# Patient Record
Sex: Female | Born: 1975 | Race: White | Hispanic: No | Marital: Married | Smoking: Never smoker
Health system: Southern US, Community
[De-identification: ages and names within clinical notes are randomized; demographics above are authoritative.]

## PROBLEM LIST (undated history)

## (undated) DIAGNOSIS — Z9889 Other specified postprocedural states: Secondary | ICD-10-CM

## (undated) DIAGNOSIS — N920 Excessive and frequent menstruation with regular cycle: Secondary | ICD-10-CM

## (undated) DIAGNOSIS — M51369 Other intervertebral disc degeneration, lumbar region without mention of lumbar back pain or lower extremity pain: Secondary | ICD-10-CM

## (undated) DIAGNOSIS — T8859XA Other complications of anesthesia, initial encounter: Secondary | ICD-10-CM

## (undated) DIAGNOSIS — Z8742 Personal history of other diseases of the female genital tract: Secondary | ICD-10-CM

## (undated) DIAGNOSIS — G43909 Migraine, unspecified, not intractable, without status migrainosus: Secondary | ICD-10-CM

## (undated) DIAGNOSIS — M1990 Unspecified osteoarthritis, unspecified site: Secondary | ICD-10-CM

## (undated) DIAGNOSIS — R112 Nausea with vomiting, unspecified: Secondary | ICD-10-CM

## (undated) DIAGNOSIS — Z8489 Family history of other specified conditions: Secondary | ICD-10-CM

## (undated) DIAGNOSIS — M5136 Other intervertebral disc degeneration, lumbar region: Secondary | ICD-10-CM

## (undated) DIAGNOSIS — Z973 Presence of spectacles and contact lenses: Secondary | ICD-10-CM

## (undated) HISTORY — PX: AUGMENTATION MAMMAPLASTY: SUR837

## (undated) HISTORY — PX: KNEE SURGERY: SHX244

## (undated) HISTORY — PX: WRIST SURGERY: SHX841

---
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
## 2000-08-05 ENCOUNTER — Other Ambulatory Visit: Admission: RE | Admit: 2000-08-05 | Discharge: 2000-08-05 | Payer: Self-pay | Admitting: Obstetrics and Gynecology

## 2001-01-17 ENCOUNTER — Other Ambulatory Visit: Admission: RE | Admit: 2001-01-17 | Discharge: 2001-01-17 | Payer: Self-pay | Admitting: Obstetrics and Gynecology

## 2001-06-11 ENCOUNTER — Other Ambulatory Visit: Admission: RE | Admit: 2001-06-11 | Discharge: 2001-06-11 | Payer: Self-pay | Admitting: Obstetrics and Gynecology

## 2001-10-01 ENCOUNTER — Other Ambulatory Visit: Admission: RE | Admit: 2001-10-01 | Discharge: 2001-10-01 | Payer: Self-pay | Admitting: Obstetrics and Gynecology

## 2002-04-03 ENCOUNTER — Other Ambulatory Visit: Admission: RE | Admit: 2002-04-03 | Discharge: 2002-04-03 | Payer: Self-pay | Admitting: Obstetrics and Gynecology

## 2002-10-14 ENCOUNTER — Other Ambulatory Visit: Admission: RE | Admit: 2002-10-14 | Discharge: 2002-10-14 | Payer: Self-pay | Admitting: Obstetrics and Gynecology

## 2002-10-16 ENCOUNTER — Other Ambulatory Visit: Admission: RE | Admit: 2002-10-16 | Discharge: 2002-10-16 | Payer: Self-pay | Admitting: Obstetrics and Gynecology

## 2003-02-13 HISTORY — PX: BREAST ENHANCEMENT SURGERY: SHX7

## 2003-05-28 ENCOUNTER — Other Ambulatory Visit: Admission: RE | Admit: 2003-05-28 | Discharge: 2003-05-28 | Payer: Self-pay | Admitting: Obstetrics and Gynecology

## 2003-11-08 ENCOUNTER — Other Ambulatory Visit: Admission: RE | Admit: 2003-11-08 | Discharge: 2003-11-08 | Payer: Self-pay | Admitting: Obstetrics and Gynecology

## 2004-11-20 ENCOUNTER — Other Ambulatory Visit: Admission: RE | Admit: 2004-11-20 | Discharge: 2004-11-20 | Payer: Self-pay | Admitting: Obstetrics and Gynecology

## 2007-03-13 ENCOUNTER — Encounter: Admission: RE | Admit: 2007-03-13 | Discharge: 2007-03-13 | Payer: Self-pay | Admitting: Occupational Medicine

## 2007-06-18 ENCOUNTER — Encounter: Admission: RE | Admit: 2007-06-18 | Discharge: 2007-06-18 | Payer: Self-pay | Admitting: Internal Medicine

## 2009-06-14 ENCOUNTER — Encounter: Admission: RE | Admit: 2009-06-14 | Discharge: 2009-06-14 | Payer: Self-pay | Admitting: Family Medicine

## 2010-02-12 DIAGNOSIS — R87612 Low grade squamous intraepithelial lesion on cytologic smear of cervix (LGSIL): Secondary | ICD-10-CM | POA: Insufficient documentation

## 2010-02-12 HISTORY — PX: KNEE ARTHROSCOPY: SUR90

## 2010-03-05 ENCOUNTER — Encounter: Payer: Self-pay | Admitting: Internal Medicine

## 2010-04-11 ENCOUNTER — Other Ambulatory Visit: Payer: Self-pay | Admitting: Internal Medicine

## 2010-04-11 DIAGNOSIS — M25562 Pain in left knee: Secondary | ICD-10-CM

## 2010-04-13 ENCOUNTER — Ambulatory Visit
Admission: RE | Admit: 2010-04-13 | Discharge: 2010-04-13 | Disposition: A | Discharge status: Home / Self Care | Payer: BC Managed Care – PPO | Source: Ambulatory Visit | Attending: Internal Medicine | Admitting: Internal Medicine

## 2010-04-13 DIAGNOSIS — M25562 Pain in left knee: Secondary | ICD-10-CM

## 2010-07-18 ENCOUNTER — Other Ambulatory Visit: Payer: Self-pay | Admitting: Sports Medicine

## 2010-07-18 ENCOUNTER — Ambulatory Visit
Admission: RE | Admit: 2010-07-18 | Discharge: 2010-07-18 | Disposition: A | Discharge status: Home / Self Care | Payer: BC Managed Care – PPO | Source: Ambulatory Visit | Attending: Sports Medicine | Admitting: Sports Medicine

## 2010-07-18 DIAGNOSIS — J45909 Unspecified asthma, uncomplicated: Secondary | ICD-10-CM

## 2010-08-10 ENCOUNTER — Other Ambulatory Visit: Payer: Self-pay | Admitting: Orthopedic Surgery

## 2010-08-10 DIAGNOSIS — M25531 Pain in right wrist: Secondary | ICD-10-CM

## 2010-08-18 ENCOUNTER — Ambulatory Visit
Admission: RE | Admit: 2010-08-18 | Discharge: 2010-08-18 | Disposition: A | Discharge status: Home / Self Care | Payer: BC Managed Care – PPO | Source: Ambulatory Visit | Attending: Orthopedic Surgery | Admitting: Orthopedic Surgery

## 2010-08-18 DIAGNOSIS — M25531 Pain in right wrist: Secondary | ICD-10-CM

## 2013-12-29 DIAGNOSIS — M84376A Stress fracture, unspecified foot, initial encounter for fracture: Secondary | ICD-10-CM | POA: Insufficient documentation

## 2014-02-12 DIAGNOSIS — R635 Abnormal weight gain: Secondary | ICD-10-CM | POA: Insufficient documentation

## 2015-03-29 DIAGNOSIS — N83201 Unspecified ovarian cyst, right side: Secondary | ICD-10-CM | POA: Insufficient documentation

## 2015-03-29 DIAGNOSIS — D259 Leiomyoma of uterus, unspecified: Secondary | ICD-10-CM | POA: Insufficient documentation

## 2015-04-26 DIAGNOSIS — N939 Abnormal uterine and vaginal bleeding, unspecified: Secondary | ICD-10-CM | POA: Insufficient documentation

## 2015-04-26 DIAGNOSIS — R232 Flushing: Secondary | ICD-10-CM | POA: Insufficient documentation

## 2015-06-22 DIAGNOSIS — N83202 Unspecified ovarian cyst, left side: Secondary | ICD-10-CM | POA: Insufficient documentation

## 2015-11-08 DIAGNOSIS — M545 Low back pain, unspecified: Secondary | ICD-10-CM | POA: Insufficient documentation

## 2016-06-05 DIAGNOSIS — N9089 Other specified noninflammatory disorders of vulva and perineum: Secondary | ICD-10-CM | POA: Insufficient documentation

## 2017-02-12 HISTORY — PX: ANTERIOR CERVICAL DECOMP/DISCECTOMY FUSION: SHX1161

## 2017-05-25 ENCOUNTER — Encounter (HOSPITAL_COMMUNITY): Payer: Self-pay | Admitting: Emergency Medicine

## 2017-05-25 ENCOUNTER — Emergency Department (HOSPITAL_COMMUNITY)
Admission: EM | Admit: 2017-05-25 | Discharge: 2017-05-25 | Disposition: A | Discharge status: Home / Self Care | Payer: BC Managed Care – PPO | Attending: Emergency Medicine | Admitting: Emergency Medicine

## 2017-05-25 ENCOUNTER — Emergency Department (HOSPITAL_COMMUNITY): Payer: BC Managed Care – PPO

## 2017-05-25 DIAGNOSIS — R200 Anesthesia of skin: Secondary | ICD-10-CM | POA: Insufficient documentation

## 2017-05-25 DIAGNOSIS — M5412 Radiculopathy, cervical region: Secondary | ICD-10-CM | POA: Diagnosis not present

## 2017-05-25 DIAGNOSIS — M542 Cervicalgia: Secondary | ICD-10-CM | POA: Diagnosis present

## 2017-05-25 DIAGNOSIS — M5410 Radiculopathy, site unspecified: Secondary | ICD-10-CM

## 2017-05-25 DIAGNOSIS — R531 Weakness: Secondary | ICD-10-CM | POA: Insufficient documentation

## 2017-05-25 HISTORY — DX: Other intervertebral disc degeneration, lumbar region without mention of lumbar back pain or lower extremity pain: M51.369

## 2017-05-25 HISTORY — DX: Other intervertebral disc degeneration, lumbar region: M51.36

## 2017-05-25 LAB — COMPREHENSIVE METABOLIC PANEL
ALBUMIN: 3.8 g/dL (ref 3.5–5.0)
ALT: 19 U/L (ref 14–54)
ANION GAP: 10 (ref 5–15)
AST: 11 U/L — ABNORMAL LOW (ref 15–41)
Alkaline Phosphatase: 42 U/L (ref 38–126)
BILIRUBIN TOTAL: 0.2 mg/dL — AB (ref 0.3–1.2)
BUN: 18 mg/dL (ref 6–20)
CALCIUM: 8.7 mg/dL — AB (ref 8.9–10.3)
CO2: 23 mmol/L (ref 22–32)
Chloride: 106 mmol/L (ref 101–111)
Creatinine, Ser: 0.74 mg/dL (ref 0.44–1.00)
GFR calc non Af Amer: >60 mL/min (ref >60)
GLUCOSE: 104 mg/dL — AB (ref 65–99)
POTASSIUM: 3.5 mmol/L (ref 3.5–5.1)
SODIUM: 139 mmol/L (ref 135–145)
TOTAL PROTEIN: 6.7 g/dL (ref 6.5–8.1)

## 2017-05-25 LAB — CBC WITH DIFFERENTIAL/PLATELET
BASOS ABS: 0 10*3/uL (ref 0.0–0.1)
BASOS PCT: 0 %
EOS ABS: 0 10*3/uL (ref 0.0–0.7)
Eosinophils Relative: 0 %
HCT: 40.5 % (ref 36.0–46.0)
Hemoglobin: 13.6 g/dL (ref 12.0–15.0)
LYMPHS ABS: 3.2 10*3/uL (ref 0.7–4.0)
Lymphocytes Relative: 23 %
MCH: 31.3 pg (ref 26.0–34.0)
MCHC: 33.6 g/dL (ref 30.0–36.0)
MCV: 93.3 fL (ref 78.0–100.0)
Monocytes Absolute: 1 10*3/uL (ref 0.1–1.0)
Monocytes Relative: 7 %
NEUTROS ABS: 9.5 10*3/uL — AB (ref 1.7–7.7)
Neutrophils Relative %: 70 %
Platelets: 403 10*3/uL — ABNORMAL HIGH (ref 150–400)
RBC: 4.34 MIL/uL (ref 3.87–5.11)
RDW: 12.2 % (ref 11.5–15.5)
WBC: 13.7 10*3/uL — ABNORMAL HIGH (ref 4.0–10.5)

## 2017-05-25 MED ORDER — OXYCODONE-ACETAMINOPHEN 5-325 MG PO TABS: 2.0000 | ORAL_TABLET | ORAL | 0 refills | Status: DC | PRN

## 2017-05-25 MED ORDER — ONDANSETRON HCL 4 MG/2ML IJ SOLN
4.0000 mg | Freq: Once | INTRAMUSCULAR | Status: AC
Start: 2017-05-25 — End: 2017-05-25
  Administered 2017-05-25: 4 mg via INTRAVENOUS
  Filled 2017-05-25: qty 2

## 2017-05-25 MED ORDER — FENTANYL CITRATE (PF) 100 MCG/2ML IJ SOLN
50.0000 ug | Freq: Once | INTRAMUSCULAR | Status: AC
  Administered 2017-05-25: 50 ug via INTRAVENOUS
  Filled 2017-05-25: qty 2

## 2017-05-25 MED ORDER — MORPHINE SULFATE (PF) 4 MG/ML IV SOLN
4.0000 mg | Freq: Once | INTRAVENOUS | Status: AC
  Administered 2017-05-25: 4 mg via INTRAVENOUS
  Filled 2017-05-25: qty 1

## 2017-05-25 MED ORDER — DIAZEPAM 5 MG PO TABS: 5.0000 mg | ORAL_TABLET | Freq: Every evening | ORAL | 0 refills | Status: DC | PRN

## 2017-05-25 MED ORDER — DIAZEPAM 5 MG PO TABS
5.0000 mg | ORAL_TABLET | Freq: Once | ORAL | Status: AC
  Administered 2017-05-25: 5 mg via ORAL
  Filled 2017-05-25: qty 1

## 2017-05-25 MED ORDER — ONDANSETRON 4 MG PO TBDP: 4.0000 mg | ORAL_TABLET | Freq: Three times a day (TID) | ORAL | 0 refills | Status: DC | PRN

## 2017-05-25 NOTE — Discharge Instructions (Addendum)
Percocet for pain. Valium for sleep. Call [facility] on Monday.  Dr. Fayrene FearingJames spoke with their provider tonight.  He will get the next available appointment.

## 2017-05-25 NOTE — ED Triage Notes (Signed)
Patient presents ambulatory stating [facility] and diagnosed with pinched nerve in her neck. Pt given steroids and medications but not having relief.

## 2017-05-25 NOTE — ED Provider Notes (Signed)
Care assumed from Dr. Macuen at 1600. I was [name] to follow-up on MRI results. I reviewed the results of the MRI and reexamined the patient.  She reports fairly classic C6 radicular pain.  She has minimal weakness to her biceps.  She has otherwise normal neurological exam she does not have subjective points, or objective findings of left upper extremity, or lower extreme the symptoms.  She has a normal gait.  Normal reflexes.  Normal strength to the left upper, and bilateral lower extremities.  She reports normal bowel bladder habits.  She has not had incontinence of bowel or bladder and has had a bowel movement and urinated today.  Discussed the findings with the provider on for [facility].  They will see her on Monday if she calls for an appointment.  Her MRI does not suggest risk of myelopathy at this point, it is her exam.  I discussed with her that she has any left upper, or lower chimney symptoms or change in bowel or bladder habits she is to return immediately to the emergency room.  This was discussed with her in front of her to family members that accompany her thorough understanding was expressed.   Stacey Rios, Stacey Popko, MD 05/25/17 1850

## 2017-05-25 NOTE — ED Notes (Signed)
Patient transferred to MRI 

## 2017-05-25 NOTE — ED Provider Notes (Signed)
[facility]-EMERGENCY DEPT Provider Note   [name]: [id number] Arrival date & time: 05/25/17  1401     History   Chief Complaint Chief Complaint  Patient presents with  . Neck Pain    HPI Stacey Rios is a 42 y.o. female.  HPI   42 year old female presenting with weakness numbness on 3 fingers on the right-hand side.  Patient reports weakness.  Having trouble difficulty holding things.  Patient has history of ligamentous injuries in both wrists with surgeries completed by Dr. Mina MarbleWeingold.  Reports that two weeks ago she went on a 15 mile kayak.  When she got back home she used and axe.   2 days after this she started developing some pain in her right trapezius.  Went to see her primary care who gave her Flexeril and a steroid taper.  It continued to get worse they did another steroid taper.  They continue to get worse now she is weak and numbness and tingling.  She is here from her primary care for MRI.  Past Medical History:  Diagnosis Date  . Degenerative disc disease, lumbar     There are no active problems to display for this patient.   Past Surgical History:  Procedure Laterality Date  . KNEE SURGERY       OB History   None      Home Medications    Prior to Admission medications   Not on File    Family History No family history on file.  Social History Social History   Tobacco Use  . Smoking status: Never Smoker  . Smokeless tobacco: Never Used  Substance Use Topics  . Alcohol use: Yes  . Drug use: Yes    Types: Marijuana     Allergies   Ibuprofen   Review of Systems Review of Systems  Neurological: Positive for weakness and numbness.  All other systems reviewed and are negative.    Physical Exam Updated Vital Signs BP (!) 154/92 (BP Location: Left Arm)   Pulse (!) 119   Temp 99.5 F (37.5 C) (Oral)   Resp 16   SpO2 100%   Physical Exam  Constitutional: She is oriented to person, place, and time. She appears  well-developed and well-nourished.  HENT:  Head: Normocephalic and atraumatic.  Eyes: Right eye exhibits no discharge. Left eye exhibits no discharge.  Cardiovascular:  No murmur heard. Tachycardic.  Pulmonary/Chest: Effort normal.  Musculoskeletal:  Numbness and tingling to first 3 fingers on right.  Weakness when fingers placed in okay sign,  disrupting circle.  Intraosseous weakness in the first 3 digits.  Neurological: She is oriented to person, place, and time.  Skin: Skin is warm and dry. She is not diaphoretic.  Psychiatric: She has a normal mood and affect.  Nursing note and vitals reviewed.    ED Treatments / Results  Labs (all labs ordered are listed, but only abnormal results are displayed) Labs Reviewed  CBC WITH DIFFERENTIAL/PLATELET  COMPREHENSIVE METABOLIC PANEL    EKG None  Radiology No results found.  Procedures Procedures (including critical care time)  Medications Ordered in ED Medications - No data to display   Initial Impression / Assessment and Plan / ED Course  I have reviewed the triage vital signs and the nursing notes.  Pertinent labs & imaging results that were available during my care of the patient were reviewed by me and considered in my medical decision making (see chart for details).     42 year old  female presenting with weakness numbness on 3 fingers on the right-hand side.  Patient reports weakness.  Having trouble difficulty holding things.  Patient has history of ligamentous injuries in both wrists with surgeries completed by Dr. Mina Marble.  Reports that two weeks ago she went on a 15 mile kayak.  When she got back home she used an axe to chop things.   2 days after this she started developing some pain in her right trapezius.  Went to see her primary care who gave her Flexeril and a steroid taper.  It continued to get worse they did another steroid taper.  They continue to get worse now she is weak and numbness and tingling.  She  is here from her primary care for MRI.  2:48 PM Given patient's new weakness and numbness, will require an MRI imaging at this time.  Will give patient pain medication while awaiting MRI.     Final Clinical Impressions(s) / ED Diagnoses   Final diagnoses:  None    ED Discharge Orders    None       Chris Cripps, Cindee Salt, MD 05/26/17 1553

## 2017-05-27 ENCOUNTER — Emergency Department (HOSPITAL_COMMUNITY)
Admission: EM | Admit: 2017-05-27 | Discharge: 2017-05-27 | Disposition: A | Discharge status: Home / Self Care | Payer: BC Managed Care – PPO | Attending: Emergency Medicine | Admitting: Emergency Medicine

## 2017-05-27 ENCOUNTER — Encounter (HOSPITAL_COMMUNITY): Payer: Self-pay | Admitting: Emergency Medicine

## 2017-05-27 DIAGNOSIS — R202 Paresthesia of skin: Secondary | ICD-10-CM | POA: Insufficient documentation

## 2017-05-27 DIAGNOSIS — Z79899 Other long term (current) drug therapy: Secondary | ICD-10-CM | POA: Insufficient documentation

## 2017-05-27 DIAGNOSIS — M5020 Other cervical disc displacement, unspecified cervical region: Secondary | ICD-10-CM

## 2017-05-27 DIAGNOSIS — M62838 Other muscle spasm: Secondary | ICD-10-CM

## 2017-05-27 DIAGNOSIS — M50222 Other cervical disc displacement at C5-C6 level: Secondary | ICD-10-CM | POA: Insufficient documentation

## 2017-05-27 DIAGNOSIS — M542 Cervicalgia: Secondary | ICD-10-CM | POA: Diagnosis present

## 2017-05-27 MED ORDER — DIAZEPAM 5 MG PO TABS: 5.0000 mg | ORAL_TABLET | Freq: Four times a day (QID) | ORAL | 0 refills | Status: DC | PRN

## 2017-05-27 MED ORDER — OXYCODONE-ACETAMINOPHEN 5-325 MG PO TABS: 1.0000 | ORAL_TABLET | Freq: Four times a day (QID) | ORAL | 0 refills | Status: DC | PRN

## 2017-05-27 MED ORDER — MELOXICAM 15 MG PO TABS: 15.0000 mg | ORAL_TABLET | Freq: Every day | ORAL | 0 refills | Status: DC

## 2017-05-27 MED ORDER — MORPHINE SULFATE (PF) 4 MG/ML IV SOLN
4.0000 mg | Freq: Once | INTRAVENOUS | Status: AC
  Administered 2017-05-27: 4 mg via INTRAMUSCULAR
  Filled 2017-05-27: qty 1

## 2017-05-27 MED ORDER — DIAZEPAM 5 MG PO TABS
5.0000 mg | ORAL_TABLET | Freq: Once | ORAL | Status: AC
  Administered 2017-05-27: 5 mg via ORAL
  Filled 2017-05-27: qty 1

## 2017-05-27 MED ORDER — ONDANSETRON 8 MG PO TBDP
8.0000 mg | ORAL_TABLET | Freq: Once | ORAL | Status: AC
  Administered 2017-05-27: 8 mg via ORAL
  Filled 2017-05-27: qty 1

## 2017-05-27 NOTE — ED Provider Notes (Addendum)
[facility]   [name]: [id number] Arrival date & time: 05/27/17  1122     History   Chief Complaint Chief Complaint  Patient presents with  . Neck Pain    HPI Stacey Rios is a 42 y.o. female with a PMHx of lumbar degenerative disc disease, who presents to the ED with complaints of ongoing unchanged R sided neck pain that's been going on for about 2wks.  Chart review reveals she was seen here on 05/25/17 for R neck pain and R hand weakness/paresthesias x2 wks after going kayaking and using an axe (symptoms started 2 days after those activities); she was given flexeril and steroid pack by her PCP however hadn't improved so she was sent to the ED; here she had a C-spine MRI performed which showed C5-6 R paracentral herniation flattening the R ventral cord and impinging the R C6 nerve root; EDP spoke with provider on call for [facility] who stated they'd see her today if she called for an appt but did not feel she needed emergent intervention; she was discharged home with Valium 5mg  (5 tabs), Oxycodone-APAP 5-325mg  (6 tabs), and zofran rx's.  She states that those medications helped somewhat however she has run out, and she called the neurosurgeon today but has not heard back yet regarding an appointment.  She denies any changes in her pain, simply states that she continues to have pain and came in because she has not yet gotten an appointment with neurosurgery so she needs refills on her medications in order to make it until she can follow-up with neurosurgery.  She describes her pain as 10/10 constant sharp stabbing and burning right-sided neck pain that radiates into the right arm, with no known aggravating factors, minimally improved with Valium and Percocet, and unrelieved with Tylenol, Flexeril, and prednisone.  She has also been using heat with no significant relief.  She reports associated right first through third digit paresthesias and  weakness which is also not changed since her last visit.  She denies fevers, chills, CP, SOB, abd pain, N/V/D/C, hematuria, dysuria, incontinence of urine/stool, saddle anesthesia/cauda equina symptoms, other arthralgias, complete numbness, other areas of tingling/weakness, or any other complaints at this time.  The history is provided by the patient and medical records. No language interpreter was used.  Neck Pain   This is a new problem. The current episode started more than 1 week ago. The problem occurs constantly. The problem has not changed since onset.The pain is associated with lifting a heavy object and twisting. There has been no fever. The pain is present in the right side. The quality of the pain is described as burning and stabbing. The pain radiates to the right shoulder and right arm. The pain is at a severity of 10/10. The pain is moderate. Exacerbated by: nothing. The pain is the same all the time. Associated symptoms include numbness (paresthesias R 1st-3rd digits), tingling and weakness (R 1st-3rd digits). Pertinent negatives include no chest pain, no bowel incontinence, no bladder incontinence and no paresis. She has tried analgesics, muscle relaxants and heat for the symptoms. The treatment provided mild relief.    Past Medical History:  Diagnosis Date  . Degenerative disc disease, lumbar     There are no active problems to display for this patient.   Past Surgical History:  Procedure Laterality Date  . KNEE SURGERY       OB History   None      Home  Medications    Prior to Admission medications   Medication Sig Start Date End Date Taking? Authorizing Provider  diazepam (VALIUM) 5 MG tablet Take 1 tablet (5 mg total) by mouth at bedtime as needed (sleep). 05/25/17   Rolland Porter, MD  ondansetron (ZOFRAN ODT) 4 MG disintegrating tablet Take 1 tablet (4 mg total) by mouth every 8 (eight) hours as needed for nausea. 05/25/17   Rolland Porter, MD  oxyCODONE-acetaminophen  (PERCOCET/ROXICET) 5-325 MG tablet Take 2 tablets by mouth every 4 (four) hours as needed. 05/25/17   Rolland Porter, MD    Family History No family history on file.  Social History Social History   Tobacco Use  . Smoking status: Never Smoker  . Smokeless tobacco: Never Used  Substance Use Topics  . Alcohol use: Yes  . Drug use: Yes    Types: Marijuana     Allergies   Ibuprofen   Review of Systems Review of Systems  Constitutional: Negative for chills and fever.  Respiratory: Negative for shortness of breath.   Cardiovascular: Negative for chest pain.  Gastrointestinal: Negative for abdominal pain, bowel incontinence, constipation, diarrhea, nausea and vomiting.  Genitourinary: Negative for bladder incontinence, difficulty urinating (no incontinence), dysuria and hematuria.  Musculoskeletal: Positive for myalgias and neck pain. Negative for arthralgias.  Skin: Negative for color change.  Allergic/Immunologic: Negative for immunocompromised state.  Neurological: Positive for tingling, weakness (R 1st-3rd digits) and numbness (paresthesias R 1st-3rd digits).  Psychiatric/Behavioral: Negative for confusion.   All other systems reviewed and are negative for acute change except as noted in the HPI.    Physical Exam Updated Vital Signs BP 131/85 (BP Location: Left Arm)   Pulse (!) 105   Temp (!) 97.5 F (36.4 C) (Oral)   Resp 18   SpO2 100%   Physical Exam  Constitutional: She is oriented to person, place, and time. Vital signs are normal. She appears well-developed and well-nourished.  Non-toxic appearance. No distress.  Afebrile, nontoxic, NAD  HENT:  Head: Normocephalic and atraumatic.  Mouth/Throat: Mucous membranes are normal.  Eyes: Conjunctivae and EOM are normal. Right eye exhibits no discharge. Left eye exhibits no discharge.  Neck: Normal range of motion. Neck supple. Muscular tenderness present. No spinous process tenderness present. No neck rigidity. Normal  range of motion present.  FROM intact although this elicits pain, no midline spinous process TTP, no bony stepoffs or deformities, with moderate R sided paraspinous muscle and trapezius muscle TTP and muscle spasms. No rigidity or meningeal signs. No bruising or swelling.   Cardiovascular: Normal rate and intact distal pulses.  Tachycardia in triage resolved upon exam  Pulmonary/Chest: Effort normal. No respiratory distress.  Abdominal: Normal appearance. She exhibits no distension.  Musculoskeletal: Normal range of motion.  C-spine as above, all other spinal levels nonTTP without bony stepoffs or deformities  MAE x4 Strength grossly intact in all extremities, including with all movements of muscle groups of the RUE Sensation grossly intact in all extremities, including in the R hand/RUE Distal pulses intact Gait steady  Neurological: She is alert and oriented to person, place, and time. She has normal strength. No sensory deficit.  Skin: Skin is warm, dry and intact. No rash noted.  Psychiatric: She has a normal mood and affect. Her behavior is normal.  Nursing note and vitals reviewed.    ED Treatments / Results  Labs (all labs ordered are listed, but only abnormal results are displayed) Labs Reviewed - No data to display  EKG None  Radiology Mr Cervical Spine Wo Contrast  Result Date: 05/25/2017 CLINICAL DATA:  Neck pain radiating down the right arm. Patient use in axial STIR day EXAM: MRI CERVICAL SPINE WITHOUT CONTRAST TECHNIQUE: Multiplanar, multisequence MR imaging of the cervical spine was performed. No intravenous contrast was administered. COMPARISON:  None. FINDINGS: Alignment: Mild reversal of cervical lordosis. Negative for listhesis. Vertebrae: No acute fracture, evidence of discitis, or bone lesion. Remote T1 spinous process fracture. Cord: Normal signal. Posterior Fossa, vertebral arteries, paraspinal tissues: Negative Disc levels: C5-6 right paracentral disc herniation  which flattens the right ventral cord and impinges on the right C6 nerve root at the foraminal entry. Mild endplate spurring in the same region. C6-7 small left uncovertebral spur. IMPRESSION: 1. C5-6 right paracentral herniation flattening the right ventral cord and impinging on the right C6 nerve root. 2. Remote T1 spinous process fracture. Electronically Signed   By: Marnee Spring M.D.   On: 05/25/2017 16:47    Procedures Procedures (including critical care time)  Medications Ordered in ED Medications  morphine 4 MG/ML injection 4 mg (4 mg Intramuscular Given 05/27/17 1432)  diazepam (VALIUM) tablet 5 mg (5 mg Oral Given 05/27/17 1431)     Initial Impression / Assessment and Plan / ED Course  I have reviewed the triage vital signs and the nursing notes.  Pertinent labs & imaging results that were available during my care of the patient were reviewed by me and considered in my medical decision making (see chart for details).     42 y.o. female here with ongoing neck pain x2 wks, was seen here 2 days ago and had MRI which showed C5-6 disc herniation with R sided C6 nerve root impingement. [facility] was consulted and did not feel emergent intervention was needed, she was advised to call today for appt, which she has but hasn't heard back. Sent home with 5 tabs of valium 5mg  and 6 tabs of percocet 5-325mg , states she's run out and came back because she still doesn't have an appt with NSG and has run out of meds. On exam, no midline spinal TTP, moderate TTP in R trapezius region with palpable spasms, FROM intact although this causes pain, all extremities NVI with soft compartments and strength grossly intact in all aspects of the RUE, no objective signs of cord compression. Doubt need for repeat labs/imaging or emergent intervention by NSG today. Advised calling NSG back and trying to get appt ASAP, will refill her meds since she got very little supply given, but advised that she will need  further refills from NSG f/up. Will also start on mobic. Advised use of heat. F/up with NSG as soon as possible. I explained the diagnosis and have given explicit precautions to return to the ER including for any other new or worsening symptoms. The patient understands and accepts the medical plan as it's been dictated and I have answered their questions. Discharge instructions concerning home care and prescriptions have been given. The patient is STABLE and is discharged to home in good condition.   [place] reviewed prior to dispensing controlled substance medications, and 2 year search was notable for: percocet and valium rx'd 05/25/17, and phentermine rx's occasionally, some hydromet/hydrocodone-homatropine syrups in 2017 but otherwise no other narcotic rx's or other controlled substances. Risks/benefits/alternatives and expectations discussed regarding controlled substances. Side effects of medications discussed. Informed consent obtained.    Final Clinical Impressions(s) / ED Diagnoses   Final diagnoses:  Cervical disc herniation  Neck pain  Muscle  spasms of neck  Paresthesias in right hand    ED Discharge Orders        Ordered    diazepam (VALIUM) 5 MG tablet  Every 6 hours PRN     05/27/17 1418    meloxicam (MOBIC) 15 MG tablet  Daily     05/27/17 1418    oxyCODONE-acetaminophen (PERCOCET/ROXICET) 5-325 MG tablet  Every 6 hours PRN     05/27/17 [address]1418       Javiel Canepa, [place], [place] 05/27/17 1434    Linwood DibblesKnapp, Jon, MD 05/28/17 1505

## 2017-05-27 NOTE — Discharge Instructions (Addendum)
Use mobic daily as directed, don't use additional NSAIDs like ibuprofen/aleve/etc while taking mobic. Use percocet as directed as needed for breakthrough pain and use valium as directed as needed for muscle relaxation. Do not drive or operate machinery with muscle relaxant or percocet use. Use heat  to areas of soreness, no more than 20 minutes at a time every hour. Follow up with the neurosurgeon in 2-3 days for ongoing management of your symptoms and recheck of your symptoms. Return to ER for emergent changing or worsening of symptoms.

## 2017-05-27 NOTE — ED Triage Notes (Signed)
Patient here from home with complaints of right side neck pain . Hx of same. Reports that she was seen here on 4/13 for same. MRI showed pinch nerve in neck. States she does not have any more pain medication and pain is getting worse.

## 2017-06-12 DIAGNOSIS — M5020 Other cervical disc displacement, unspecified cervical region: Secondary | ICD-10-CM | POA: Insufficient documentation

## 2017-06-12 DIAGNOSIS — M5412 Radiculopathy, cervical region: Secondary | ICD-10-CM | POA: Insufficient documentation

## 2018-02-10 DIAGNOSIS — R6889 Other general symptoms and signs: Secondary | ICD-10-CM | POA: Insufficient documentation

## 2018-10-21 ENCOUNTER — Ambulatory Visit (INDEPENDENT_AMBULATORY_CARE_PROVIDER_SITE_OTHER): Payer: BC Managed Care – PPO

## 2018-10-21 ENCOUNTER — Ambulatory Visit
Admission: EM | Admit: 2018-10-21 | Discharge: 2018-10-21 | Disposition: A | Discharge status: Home / Self Care | Payer: BC Managed Care – PPO

## 2018-10-21 DIAGNOSIS — S93401A Sprain of unspecified ligament of right ankle, initial encounter: Secondary | ICD-10-CM

## 2018-10-21 HISTORY — DX: Migraine, unspecified, not intractable, without status migrainosus: G43.909

## 2018-10-21 NOTE — ED Triage Notes (Addendum)
Pt states sprained rt ankle on 8/16, then on 8/23 sprained both ankles while hiking. C/o pain and swelling

## 2018-10-21 NOTE — ED Notes (Signed)
Pt declined ASO wrap

## 2018-10-21 NOTE — Discharge Instructions (Signed)
Take ibuprofen, Tylenol as needed for pain. Important to rest, ice, compress, elevate ankle. May wear ASO brace for additional pain relief and support. Follow-up with sports medicine in 1 week for further evaluation. Return for worsening pain, swelling, reinjury.

## 2018-10-21 NOTE — ED Provider Notes (Signed)
[name]    [name]: [id number] Arrival date & time: 10/21/18  1447      History   Chief Complaint Chief Complaint  Patient presents with  . Ankle Pain    HPI Scherry Elpidio AnisM Kluttz is a 43 y.o. female presenting for bilateral ankle pain and swelling since 8/23.  Patient states that she sprained her right ankle on 8/16, then went on a hike on 8/23.  States that she with her right ankle "really bad that time ".  And injured her left ankle as well.  Patient states her right ankle hurts more than left.  Has had some difficulty ambulating.  Has not had evaluation for this since injury x2.   Past Medical History:  Diagnosis Date  . Degenerative disc disease, lumbar   . Migraines     There are no active problems to display for this patient.   Past Surgical History:  Procedure Laterality Date  . KNEE SURGERY      OB History   No obstetric history on file.      Home Medications    Prior to Admission medications   Medication Sig Start Date End Date Taking? Authorizing Provider  imipramine (TOFRANIL) 10 MG tablet Take 10 mg by mouth daily.   Yes [provider]    Family History No family history on file.  Social History Social History   Tobacco Use  . Smoking status: Never Smoker  . Smokeless tobacco: Never Used  Substance Use Topics  . Alcohol use: Yes  . Drug use: Yes    Types: Marijuana     Allergies   Ibuprofen   Review of Systems Review of Systems  Constitutional: Negative for fatigue and fever.  Respiratory: Negative for cough and shortness of breath.   Cardiovascular: Negative for chest pain and palpitations.  Musculoskeletal:       Positive for bilateral ankle pain, radial swelling Negative for numbness, weakness  Neurological: Negative for weakness and numbness.     Physical Exam Triage Vital Signs ED Triage Vitals  Enc Vitals Group     BP      Pulse      Resp      Temp      Temp src      SpO2      Weight    Height      Head Circumference      Peak Flow      Pain Score      Pain Loc      Pain Edu?      Excl. in GC?    No data found.  Updated Vital Signs BP (!) 156/94 (BP Location: Left Arm)   Pulse 81   Temp 98.2 F (36.8 C) (Oral)   Resp 18   LMP 10/20/2018   Visual Acuity Right Eye Distance:   Left Eye Distance:   Bilateral Distance:    Right Eye Near:   Left Eye Near:    Bilateral Near:     Physical Exam Constitutional:      General: She is not in acute distress. HENT:     Head: Normocephalic and atraumatic.  Eyes:     General: No scleral icterus.    Conjunctiva/sclera: Conjunctivae normal.     Pupils: Pupils are equal, round, and reactive to light.  Cardiovascular:     Rate and Rhythm: Normal rate.  Pulmonary:     Effort: Pulmonary effort is normal. No respiratory distress.  Musculoskeletal:  Comments: Bilateral ankle edema (R>L) without ecchymosis, erythema, warmth.  No medial or lateral malleolus tenderness, though right ankle with inferior lateral malleolus tenderness.  Patient endorsing pain with full dorsiflexion, plantarflexion.  No dorsal foot bony tenderness.  Gait mildly antalgic, favoring right.  Strength 5/5 bilaterally with intact DTRs. NVI  Neurological:     General: No focal deficit present.     Mental Status: She is alert.      UC Treatments / Results  Labs (all labs ordered are listed, but only abnormal results are displayed) Labs Reviewed - No data to display  EKG   Radiology Dg Foot Complete Right  Result Date: 10/21/2018 CLINICAL DATA:  43 year old female with right foot injury and pain. EXAM: RIGHT FOOT COMPLETE - 3+ VIEW COMPARISON:  None. FINDINGS: There is no acute fracture or dislocation. No arthritic changes. Mild soft tissue swelling the forefoot. No radiopaque foreign object or soft tissue gas. IMPRESSION: Negative. Electronically Signed   By: Anner Crete M.D.   On: 10/21/2018 15:52    Procedures Procedures (including  critical care time)  Medications Ordered in UC Medications - No data to display  Initial Impression / Assessment and Plan / UC Course  I have reviewed the triage vital signs and the nursing notes.  Pertinent labs & imaging results that were available during my care of the patient were reviewed by me and considered in my medical decision making (see chart for details).     1.  Moderate right ankle sprain Right foot x-ray done in office, reviewed by me radiology: Mild soft tissue swelling of the forefoot, though no acute fracture dislocation.  Reviewed findings with patient verbalized understanding.  Offered ASO brace, patient declined stating she would get this online.  Recommended sports medicine follow-up, specifically if having continued pain, swelling over the next week.  Return precautions discussed, patient verbalized understanding and is agreeable to plan. Final Clinical Impressions(s) / UC Diagnoses   Final diagnoses:  Moderate right ankle sprain, initial encounter     Discharge Instructions     Take ibuprofen, Tylenol as needed for pain. Important to rest, ice, compress, elevate ankle. May wear ASO brace for additional pain relief and support. Follow-up with sports medicine in 1 week for further evaluation. Return for worsening pain, swelling, reinjury.    ED Prescriptions    None     Controlled Substance Prescriptions [place] Controlled Substance Registry consulted? Not Applicable   Quincy Sheehan, [place] 10/21/18 1630

## 2019-04-01 DIAGNOSIS — M542 Cervicalgia: Secondary | ICD-10-CM | POA: Insufficient documentation

## 2019-10-05 ENCOUNTER — Encounter: Payer: Self-pay | Admitting: Emergency Medicine

## 2019-10-05 ENCOUNTER — Ambulatory Visit
Admission: EM | Admit: 2019-10-05 | Discharge: 2019-10-05 | Disposition: A | Discharge status: Home / Self Care | Payer: BC Managed Care – PPO | Attending: Emergency Medicine | Admitting: Emergency Medicine

## 2019-10-05 ENCOUNTER — Other Ambulatory Visit: Payer: Self-pay

## 2019-10-05 DIAGNOSIS — W57XXXA Bitten or stung by nonvenomous insect and other nonvenomous arthropods, initial encounter: Secondary | ICD-10-CM

## 2019-10-05 DIAGNOSIS — M7989 Other specified soft tissue disorders: Secondary | ICD-10-CM | POA: Diagnosis not present

## 2019-10-05 MED ORDER — HYDROXYZINE HCL 25 MG PO TABS: 25.0000 mg | ORAL_TABLET | Freq: Four times a day (QID) | ORAL | 0 refills | Status: DC

## 2019-10-05 NOTE — Discharge Instructions (Signed)
Keep area(s) clean and dry. Apply ice for minutes 3-5 times daily. Return for worsening pain, redness, swelling, discharge, fever.

## 2019-10-05 NOTE — ED Triage Notes (Signed)
Pt here for insect bite to left shoulder yesterday that is now swollen and warm down upper arm

## 2019-10-05 NOTE — ED Provider Notes (Signed)
[name]    [name]: [id number] Arrival date & time: 10/05/19  1807      History   Chief Complaint Chief Complaint  Patient presents with  . Insect Bite    HPI Stacey Rios is a 44 y.o. female  Presenting for insect bite to left shoulder.  States it occurred yesterday: Was a horse fly.  States that it was warm, red, swollen yesterday: Took Benadryl.  States this morning her lower upper arm feels the same.  Denies fever, arthralgias, myalgias, headaches, fatigue.  Past Medical History:  Diagnosis Date  . Degenerative disc disease, lumbar   . Migraines     There are no problems to display for this patient.   Past Surgical History:  Procedure Laterality Date  . KNEE SURGERY      OB History   No obstetric history on file.      Home Medications    Prior to Admission medications   Medication Sig Start Date End Date Taking? Authorizing Provider  hydrOXYzine (ATARAX/VISTARIL) 25 MG tablet Take 1 tablet (25 mg total) by mouth every 6 (six) hours. 10/05/19   Hall-Potvin, [place], PA-C  imipramine (TOFRANIL) 10 MG tablet Take 10 mg by mouth daily.    [provider]    Family History History reviewed. No pertinent family history.  Social History Social History   Tobacco Use  . Smoking status: Never Smoker  . Smokeless tobacco: Never Used  Substance Use Topics  . Alcohol use: Yes  . Drug use: Yes    Types: Marijuana     Allergies   Ibuprofen   Review of Systems As per HPI   Physical Exam Triage Vital Signs ED Triage Vitals  Enc Vitals Group     BP 10/05/19 1847 (!) 142/86     Pulse Rate 10/05/19 1847 96     Resp 10/05/19 1847 18     Temp 10/05/19 1847 97.9 F (36.6 C)     Temp Source 10/05/19 1847 Oral     SpO2 10/05/19 1847 99 %     Weight --      Height --      Head Circumference --      Peak Flow --      Pain Score 10/05/19 1852 8     Pain Loc --      Pain Edu? --      Excl. in GC? --    No data found.   Updated Vital Signs BP (!) 142/86 (BP Location: Left Arm)   Pulse 96   Temp 97.9 F (36.6 C) (Oral)   Resp 18   SpO2 99%   Visual Acuity Right Eye Distance:   Left Eye Distance:   Bilateral Distance:    Right Eye Near:   Left Eye Near:    Bilateral Near:     Physical Exam Constitutional:      General: She is not in acute distress. HENT:     Head: Normocephalic and atraumatic.  Eyes:     General: No scleral icterus.    Pupils: Pupils are equal, round, and reactive to light.  Cardiovascular:     Rate and Rhythm: Normal rate.  Pulmonary:     Effort: Pulmonary effort is normal.  Skin:    Coloration: Skin is not jaundiced or pale.     Comments: Mild warmth and swelling of distal tricep.  No rash, open wound, discharge, induration or fluctuance.  NVI  Neurological:     Mental  Status: She is alert and oriented to person, place, and time.      UC Treatments / Results  Labs (all labs ordered are listed, but only abnormal results are displayed) Labs Reviewed - No data to display  EKG   Radiology No results found.  Procedures Procedures (including critical care time)  Medications Ordered in UC Medications - No data to display  Initial Impression / Assessment and Plan / UC Course  I have reviewed the triage vital signs and the nursing notes.  Pertinent labs & imaging results that were available during my care of the patient were reviewed by me and considered in my medical decision making (see chart for details).     Patient febrile, nontoxic in office today.  Low concern for secondary cellulitis at this time.  Reviewed supportive care as outlined below.  Return precautions discussed, pt verbalized understanding and is agreeable to plan. Final Clinical Impressions(s) / UC Diagnoses   Final diagnoses:  Bug bite, initial encounter  Left arm swelling     Discharge Instructions     Keep area(s) clean and dry. Apply ice for minutes 3-5 times daily. Return for  worsening pain, redness, swelling, discharge, fever.    ED Prescriptions    Medication Sig Dispense Auth. Provider   hydrOXYzine (ATARAX/VISTARIL) 25 MG tablet Take 1 tablet (25 mg total) by mouth every 6 (six) hours. 12 tablet Hall-Potvin, [place], PA-C     PDMP not reviewed this encounter.   Hall-Potvin, [place], [place] 10/06/19 [phone]

## 2020-01-03 IMAGING — DX DG FOOT COMPLETE 3+V*R*
3 series · 3 of 3 positions shown · non-contrast
Comparison: None.

CLINICAL DATA: 43-year-old female with right foot injury and pain.

EXAM:
RIGHT FOOT COMPLETE - 3+ VIEW

[foot supine dp]
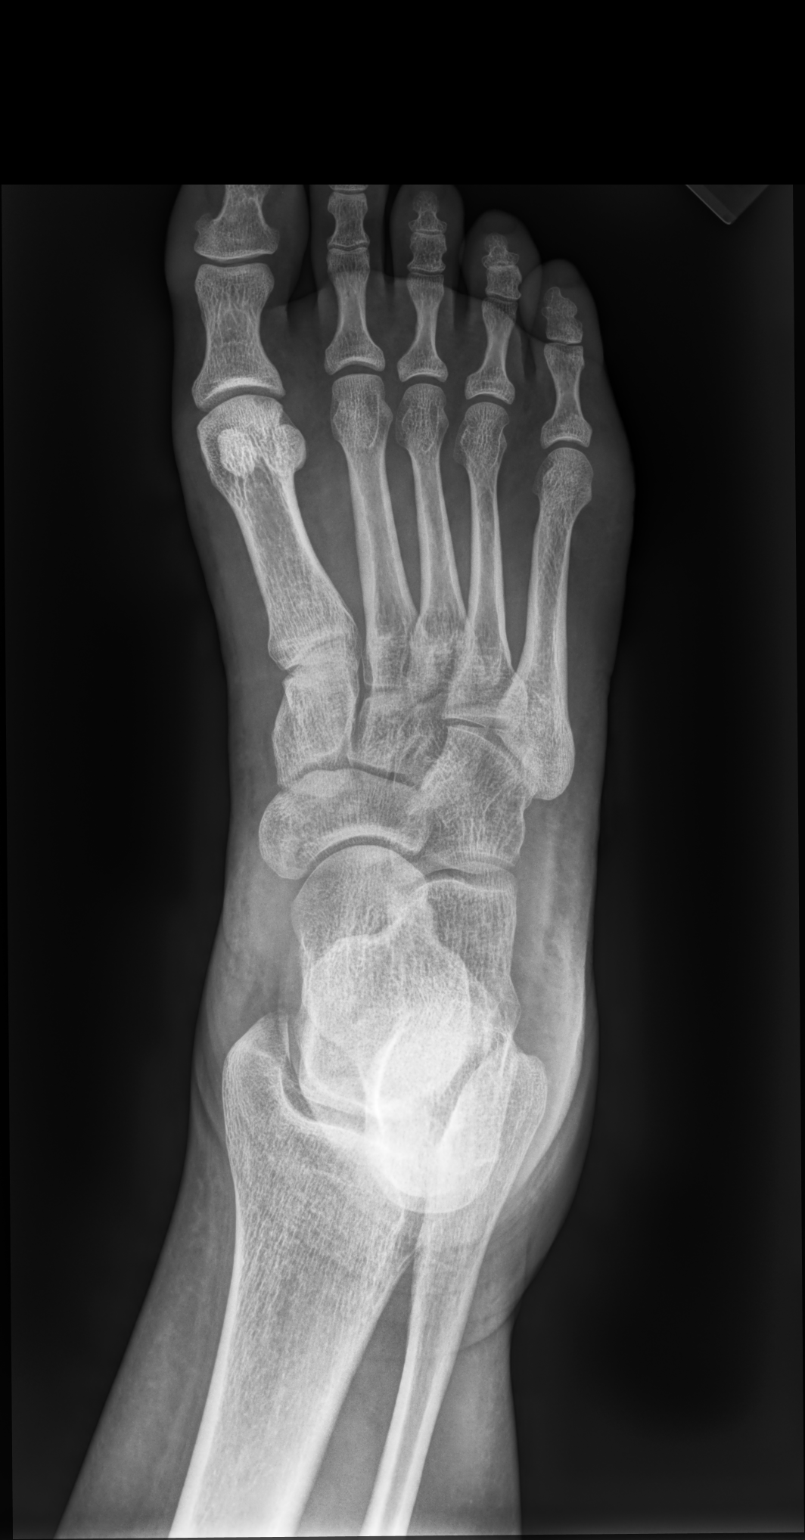

[foot medial oblique]
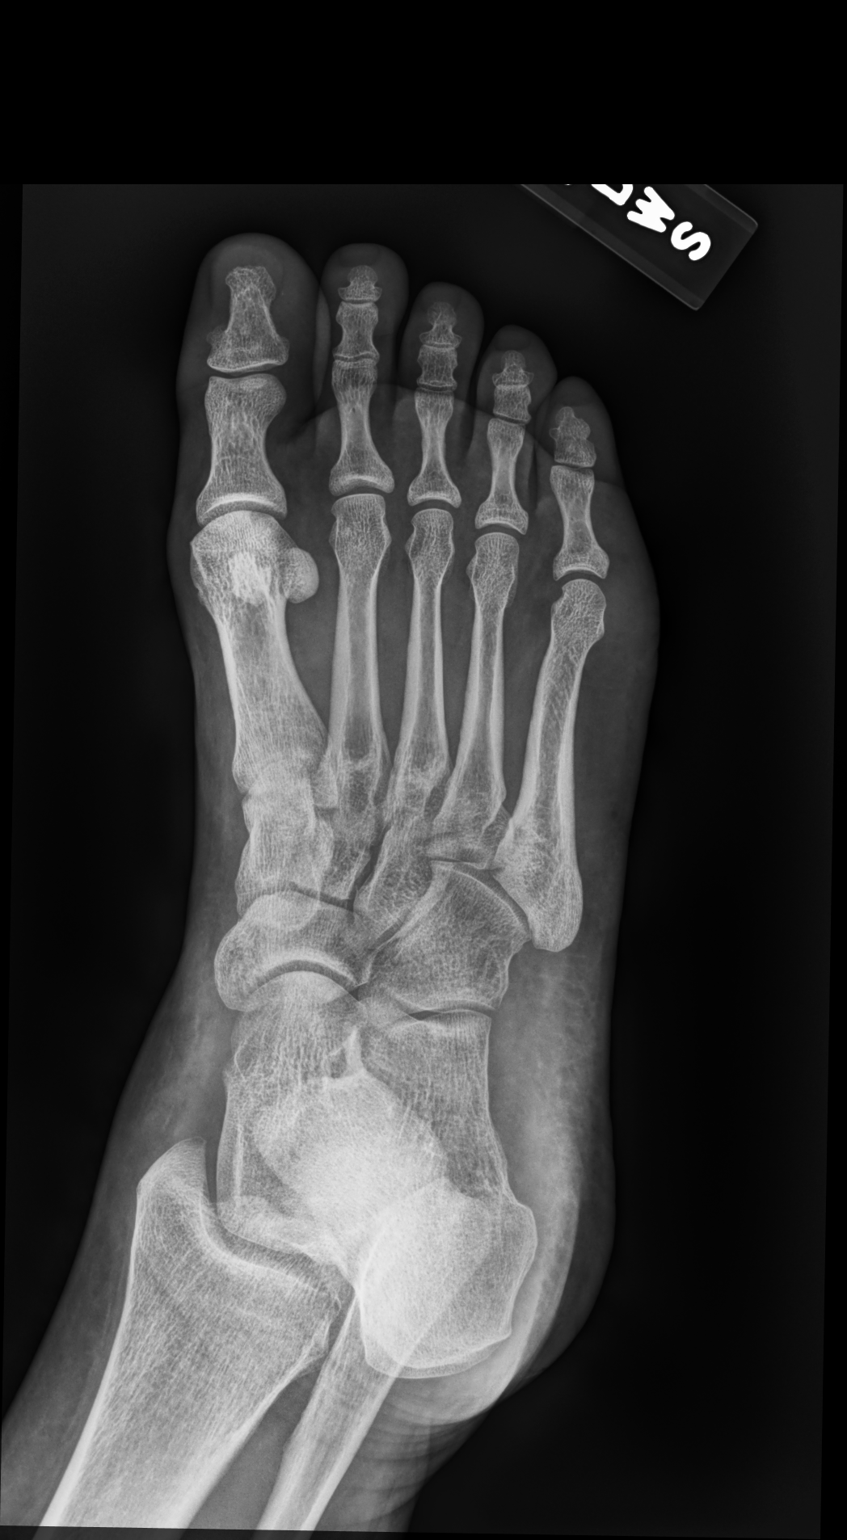

[foot supine lat]
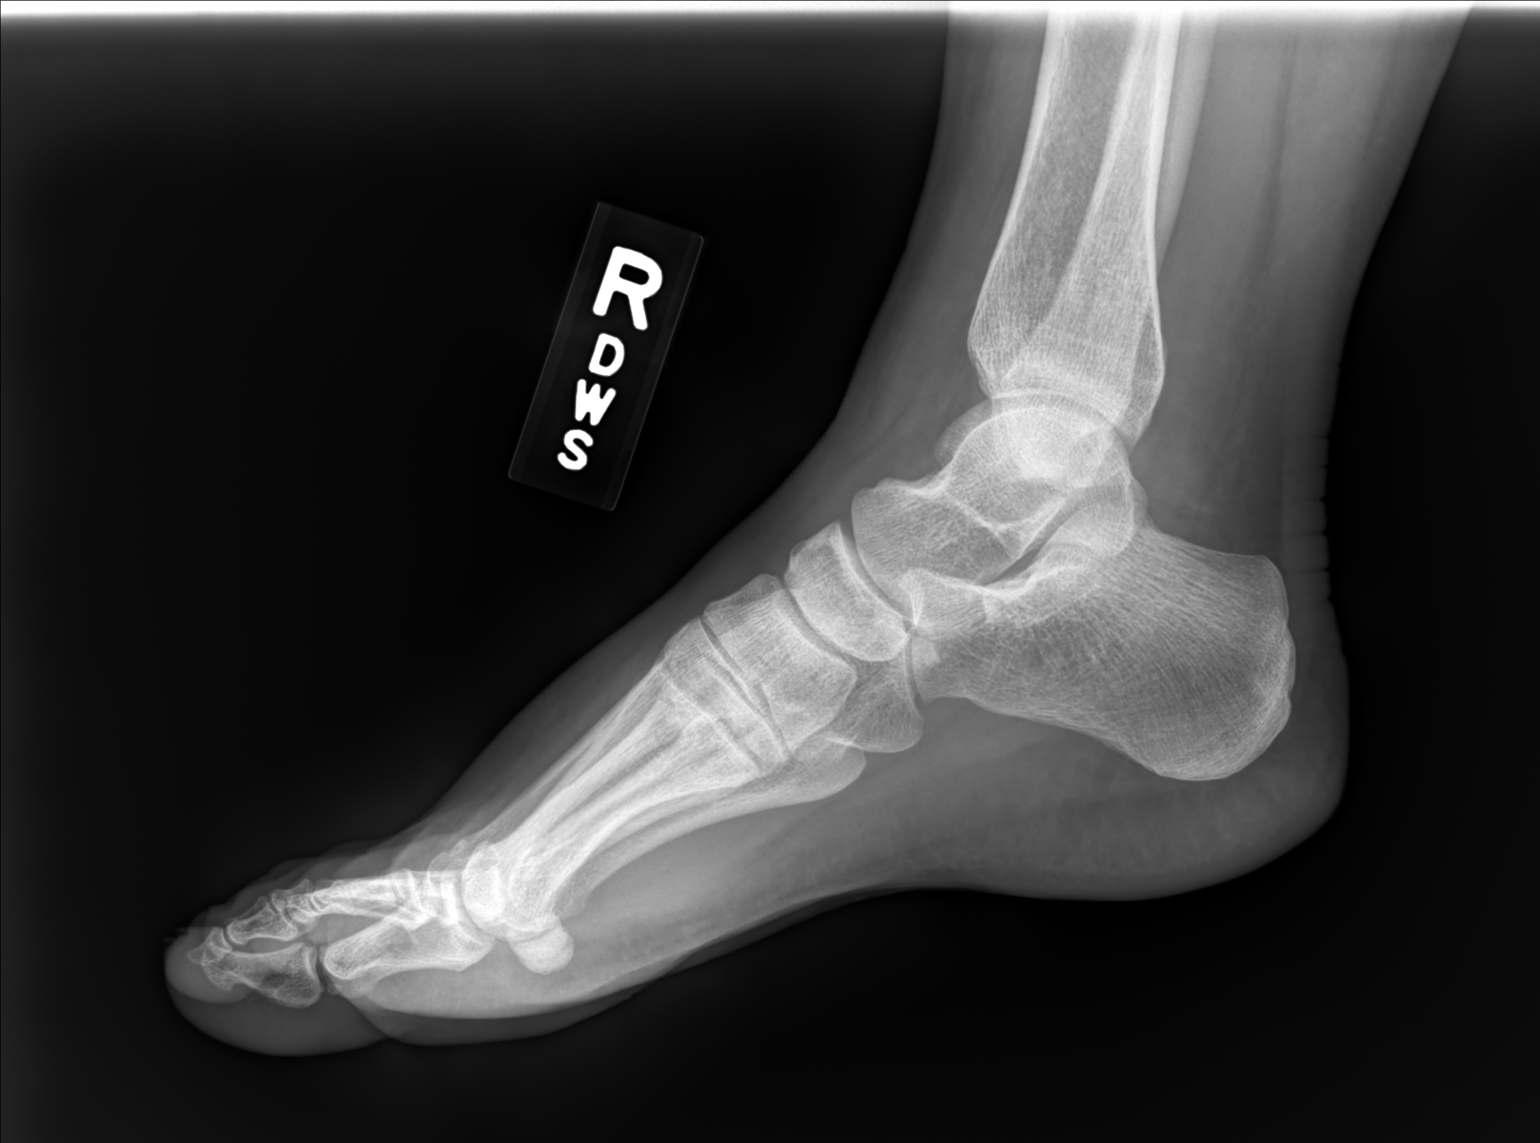

[3 of 3 positions shown; findings below may reference images not displayed]

FINDINGS: There is no acute fracture or dislocation. No arthritic changes.
Mild soft tissue swelling the forefoot. No radiopaque foreign object
or soft tissue gas.
IMPRESSION: Negative.

## 2021-02-24 ENCOUNTER — Other Ambulatory Visit: Payer: Self-pay

## 2021-02-24 ENCOUNTER — Encounter (HOSPITAL_BASED_OUTPATIENT_CLINIC_OR_DEPARTMENT_OTHER): Payer: Self-pay | Admitting: Obstetrics and Gynecology

## 2021-02-24 NOTE — Progress Notes (Signed)
Spoke w/ via phone for pre-op interview--- pt Lab needs dos----  urine preg, t&s             Lab results------ no COVID test -----patient states asymptomatic no test needed Arrive at ------- 1000 on 03-02-2021 NPO after MN NO Solid Food.  Clear liquids from MN until--- 0900 Med rec completed Medications to take morning of surgery ----- imipramine Diabetic medication ----- n/a Patient instructed no nail polish to be worn day of surgery Patient instructed to bring photo id and insurance card day of surgery Patient aware to have Driver (ride ) / caregiver for 24 hours after surgery --- Pt was unsure whom would be ride/ caregiver but did verbalized understanding she has to have responsible person 29 yrs old or older as driver and caregiver and to have name/ phone number when checks in. Patient Special Instructions ----- n/a Pre-Op special Istructions ----- n/a Patient verbalized understanding of instructions that were given at this phone interview. Patient denies shortness of breath, chest pain, fever, cough at this phone interview.

## 2021-03-02 ENCOUNTER — Encounter (HOSPITAL_BASED_OUTPATIENT_CLINIC_OR_DEPARTMENT_OTHER): Payer: Self-pay | Admitting: Obstetrics and Gynecology

## 2021-03-02 ENCOUNTER — Encounter (HOSPITAL_BASED_OUTPATIENT_CLINIC_OR_DEPARTMENT_OTHER)
Admission: RE | Disposition: A | Discharge status: Home / Self Care | Payer: Self-pay | Source: Home / Self Care | Attending: Obstetrics and Gynecology

## 2021-03-02 ENCOUNTER — Ambulatory Visit (HOSPITAL_BASED_OUTPATIENT_CLINIC_OR_DEPARTMENT_OTHER): Payer: BC Managed Care – PPO | Admitting: Anesthesiology

## 2021-03-02 ENCOUNTER — Other Ambulatory Visit: Payer: Self-pay

## 2021-03-02 ENCOUNTER — Ambulatory Visit (HOSPITAL_BASED_OUTPATIENT_CLINIC_OR_DEPARTMENT_OTHER)
Admission: RE | Admit: 2021-03-02 | Discharge: 2021-03-02 | Disposition: A | Discharge status: Home / Self Care | Payer: BC Managed Care – PPO | Attending: Obstetrics and Gynecology | Admitting: Obstetrics and Gynecology

## 2021-03-02 DIAGNOSIS — N939 Abnormal uterine and vaginal bleeding, unspecified: Secondary | ICD-10-CM | POA: Diagnosis not present

## 2021-03-02 DIAGNOSIS — N924 Excessive bleeding in the premenopausal period: Secondary | ICD-10-CM

## 2021-03-02 DIAGNOSIS — Z302 Encounter for sterilization: Secondary | ICD-10-CM | POA: Diagnosis not present

## 2021-03-02 DIAGNOSIS — F1290 Cannabis use, unspecified, uncomplicated: Secondary | ICD-10-CM | POA: Diagnosis not present

## 2021-03-02 HISTORY — DX: Unspecified osteoarthritis, unspecified site: M19.90

## 2021-03-02 HISTORY — DX: Excessive and frequent menstruation with regular cycle: N92.0

## 2021-03-02 HISTORY — PX: DILITATION & CURRETTAGE/HYSTROSCOPY WITH NOVASURE ABLATION: SHX5568

## 2021-03-02 HISTORY — PX: LAPAROSCOPIC BILATERAL SALPINGECTOMY: SHX5889

## 2021-03-02 HISTORY — DX: Personal history of other diseases of the female genital tract: Z87.42

## 2021-03-02 HISTORY — DX: Presence of spectacles and contact lenses: Z97.3

## 2021-03-02 HISTORY — DX: Nausea with vomiting, unspecified: R11.2

## 2021-03-02 HISTORY — DX: Family history of other specified conditions: Z84.89

## 2021-03-02 HISTORY — DX: Other specified postprocedural states: Z98.890

## 2021-03-02 HISTORY — DX: Other complications of anesthesia, initial encounter: T88.59XA

## 2021-03-02 LAB — ABO/RH: ABO/RH(D): A POS

## 2021-03-02 LAB — TYPE AND SCREEN
ABO/RH(D): A POS
Antibody Screen: NEGATIVE

## 2021-03-02 LAB — POCT PREGNANCY, URINE: Preg Test, Ur: NEGATIVE

## 2021-03-02 SURGERY — DILATATION & CURETTAGE/HYSTEROSCOPY WITH NOVASURE ABLATION
Anesthesia: General | Site: Vagina

## 2021-03-02 MED ORDER — APREPITANT 40 MG PO CAPS
40.0000 mg | ORAL_CAPSULE | Freq: Once | ORAL | Status: AC
  Administered 2021-03-02: 40 mg via ORAL

## 2021-03-02 MED ORDER — OXYCODONE HCL 5 MG PO TABS: 5.0000 mg | ORAL_TABLET | Freq: Once | ORAL | Status: DC | PRN

## 2021-03-02 MED ORDER — PROPOFOL 10 MG/ML IV BOLUS
INTRAVENOUS | Status: DC | PRN
  Administered 2021-03-02: 150 mg via INTRAVENOUS
  Administered 2021-03-02: 50 mg via INTRAVENOUS

## 2021-03-02 MED ORDER — 0.9 % SODIUM CHLORIDE (POUR BTL) OPTIME
TOPICAL | Status: DC | PRN
  Administered 2021-03-02: 500 mL

## 2021-03-02 MED ORDER — PHENYLEPHRINE HCL (PRESSORS) 10 MG/ML IV SOLN
INTRAVENOUS | Status: DC | PRN
  Administered 2021-03-02 (×4): 80 ug via INTRAVENOUS

## 2021-03-02 MED ORDER — GLYCOPYRROLATE PF 0.2 MG/ML IJ SOSY
PREFILLED_SYRINGE | INTRAMUSCULAR | Status: AC
  Filled 2021-03-02: qty 1

## 2021-03-02 MED ORDER — OXYCODONE HCL 5 MG/5ML PO SOLN: 5.0000 mg | Freq: Once | ORAL | Status: DC | PRN

## 2021-03-02 MED ORDER — PHENYLEPHRINE 40 MCG/ML (10ML) SYRINGE FOR IV PUSH (FOR BLOOD PRESSURE SUPPORT)
PREFILLED_SYRINGE | INTRAVENOUS | Status: AC
  Filled 2021-03-02: qty 10

## 2021-03-02 MED ORDER — LACTATED RINGERS IV SOLN: INTRAVENOUS | Status: DC

## 2021-03-02 MED ORDER — PROPOFOL 10 MG/ML IV BOLUS
INTRAVENOUS | Status: AC
  Filled 2021-03-02: qty 20

## 2021-03-02 MED ORDER — SUGAMMADEX SODIUM 200 MG/2ML IV SOLN
INTRAVENOUS | Status: DC | PRN
  Administered 2021-03-02: 200 mg via INTRAVENOUS

## 2021-03-02 MED ORDER — PROMETHAZINE HCL 25 MG/ML IJ SOLN: 6.2500 mg | INTRAMUSCULAR | Status: DC | PRN

## 2021-03-02 MED ORDER — MIDAZOLAM HCL 2 MG/2ML IJ SOLN
INTRAMUSCULAR | Status: AC
  Filled 2021-03-02: qty 2

## 2021-03-02 MED ORDER — DEXAMETHASONE SODIUM PHOSPHATE 10 MG/ML IJ SOLN
INTRAMUSCULAR | Status: AC
  Filled 2021-03-02: qty 1

## 2021-03-02 MED ORDER — LIDOCAINE HCL 1 % IJ SOLN
INTRAMUSCULAR | Status: DC | PRN
  Administered 2021-03-02: 10 mL

## 2021-03-02 MED ORDER — ACETAMINOPHEN 500 MG PO TABS
ORAL_TABLET | ORAL | Status: AC
  Filled 2021-03-02: qty 2

## 2021-03-02 MED ORDER — KETOROLAC TROMETHAMINE 30 MG/ML IJ SOLN
INTRAMUSCULAR | Status: DC | PRN
  Administered 2021-03-02: 30 mg via INTRAVENOUS

## 2021-03-02 MED ORDER — ACETAMINOPHEN 500 MG PO TABS
1000.0000 mg | ORAL_TABLET | Freq: Once | ORAL | Status: AC
  Administered 2021-03-02: 1000 mg via ORAL

## 2021-03-02 MED ORDER — POVIDONE-IODINE 10 % EX SWAB: 2.0000 "application " | Freq: Once | CUTANEOUS | Status: DC

## 2021-03-02 MED ORDER — AMISULPRIDE (ANTIEMETIC) 5 MG/2ML IV SOLN: 10.0000 mg | Freq: Once | INTRAVENOUS | Status: DC | PRN

## 2021-03-02 MED ORDER — MIDAZOLAM HCL 5 MG/5ML IJ SOLN
INTRAMUSCULAR | Status: DC | PRN
  Administered 2021-03-02: 2 mg via INTRAVENOUS

## 2021-03-02 MED ORDER — SCOPOLAMINE 1 MG/3DAYS TD PT72
1.0000 | MEDICATED_PATCH | TRANSDERMAL | Status: DC
  Administered 2021-03-02: 1.5 mg via TRANSDERMAL

## 2021-03-02 MED ORDER — BUPIVACAINE HCL 0.5 % IJ SOLN
INTRAMUSCULAR | Status: DC | PRN
  Administered 2021-03-02: 6 mL

## 2021-03-02 MED ORDER — LIDOCAINE HCL (CARDIAC) PF 100 MG/5ML IV SOSY
PREFILLED_SYRINGE | INTRAVENOUS | Status: DC | PRN
Start: 2021-03-02 — End: 2021-03-02
  Administered 2021-03-02: 60 mg via INTRAVENOUS

## 2021-03-02 MED ORDER — HYDROMORPHONE HCL 1 MG/ML IJ SOLN: 0.2500 mg | INTRAMUSCULAR | Status: DC | PRN

## 2021-03-02 MED ORDER — FENTANYL CITRATE (PF) 100 MCG/2ML IJ SOLN
INTRAMUSCULAR | Status: DC | PRN
  Administered 2021-03-02 (×5): 50 ug via INTRAVENOUS

## 2021-03-02 MED ORDER — GLYCOPYRROLATE 0.2 MG/ML IJ SOLN
INTRAMUSCULAR | Status: DC | PRN
  Administered 2021-03-02: .2 mg via INTRAVENOUS

## 2021-03-02 MED ORDER — OXYCODONE HCL 5 MG PO TABS: 5.0000 mg | ORAL_TABLET | Freq: Four times a day (QID) | ORAL | Status: DC | PRN

## 2021-03-02 MED ORDER — ONDANSETRON HCL 4 MG/2ML IJ SOLN
INTRAMUSCULAR | Status: DC | PRN
  Administered 2021-03-02: 4 mg via INTRAVENOUS

## 2021-03-02 MED ORDER — SODIUM CHLORIDE 0.9 % IR SOLN
Status: DC | PRN
  Administered 2021-03-02: 2000 mL

## 2021-03-02 MED ORDER — ROCURONIUM BROMIDE 100 MG/10ML IV SOLN
INTRAVENOUS | Status: DC | PRN
  Administered 2021-03-02: 100 mg via INTRAVENOUS

## 2021-03-02 MED ORDER — OXYCODONE HCL 5 MG PO TABS: 5.0000 mg | ORAL_TABLET | Freq: Four times a day (QID) | ORAL | 0 refills | Status: DC | PRN

## 2021-03-02 MED ORDER — SCOPOLAMINE 1 MG/3DAYS TD PT72
MEDICATED_PATCH | TRANSDERMAL | Status: AC
  Filled 2021-03-02: qty 1

## 2021-03-02 MED ORDER — FENTANYL CITRATE (PF) 250 MCG/5ML IJ SOLN
INTRAMUSCULAR | Status: AC
  Filled 2021-03-02: qty 5

## 2021-03-02 MED ORDER — ONDANSETRON HCL 4 MG/2ML IJ SOLN
INTRAMUSCULAR | Status: AC
  Filled 2021-03-02: qty 2

## 2021-03-02 MED ORDER — ROCURONIUM BROMIDE 10 MG/ML (PF) SYRINGE
PREFILLED_SYRINGE | INTRAVENOUS | Status: AC
  Filled 2021-03-02: qty 10

## 2021-03-02 MED ORDER — APREPITANT 40 MG PO CAPS
ORAL_CAPSULE | ORAL | Status: AC
  Filled 2021-03-02: qty 1

## 2021-03-02 MED ORDER — MEPERIDINE HCL 25 MG/ML IJ SOLN: 6.2500 mg | INTRAMUSCULAR | Status: DC | PRN

## 2021-03-02 MED ORDER — DEXAMETHASONE SODIUM PHOSPHATE 4 MG/ML IJ SOLN
INTRAMUSCULAR | Status: DC | PRN
  Administered 2021-03-02: 10 mg via INTRAVENOUS

## 2021-03-02 SURGICAL SUPPLY — 51 items
ABLATOR SURESOUND NOVASURE (ABLATOR) IMPLANT
ADH SKN CLS APL DERMABOND .7 (GAUZE/BANDAGES/DRESSINGS) ×2
APL PRP STRL LF DISP 70% ISPRP (MISCELLANEOUS) ×2
BAG SPEC RTRVL LRG 6X4 10 (ENDOMECHANICALS)
CATH ROBINSON RED A/P 16FR (CATHETERS) ×3 IMPLANT
CHLORAPREP W/TINT 26 (MISCELLANEOUS) ×4 IMPLANT
DERMABOND ADVANCED (GAUZE/BANDAGES/DRESSINGS) ×1
DERMABOND ADVANCED .7 DNX12 (GAUZE/BANDAGES/DRESSINGS) ×3 IMPLANT
DEVICE TROCAR PUNCTURE CLOSURE (ENDOMECHANICALS) IMPLANT
DRSG OPSITE POSTOP 3X4 (GAUZE/BANDAGES/DRESSINGS) ×4 IMPLANT
DRSG TELFA 3X8 NADH (GAUZE/BANDAGES/DRESSINGS) ×3 IMPLANT
GAUZE 4X4 16PLY ~~LOC~~+RFID DBL (SPONGE) ×8 IMPLANT
GLOVE SURG ENC MOIS LTX SZ6.5 (GLOVE) ×4 IMPLANT
GLOVE SURG LTX SZ6.5 (GLOVE) ×4 IMPLANT
GLOVE SURG UNDER POLY LF SZ6.5 (GLOVE) ×4 IMPLANT
GLOVE SURG UNDER POLY LF SZ7 (GLOVE) ×12 IMPLANT
GOWN STRL REUS W/TWL LRG LVL3 (GOWN DISPOSABLE) ×9 IMPLANT
KIT PROCEDURE FLUENT (KITS) ×4 IMPLANT
KIT TURNOVER CYSTO (KITS) ×4 IMPLANT
LIGASURE VESSEL 5MM BLUNT TIP (ELECTROSURGICAL) ×1 IMPLANT
NDL INSUFFLATION 14GA 150MM (NEEDLE) IMPLANT
NDL SPNL 22GX3.5 QUINCKE BK (NEEDLE) IMPLANT
NEEDLE INSUFFLATION 120MM (ENDOMECHANICALS) ×1 IMPLANT
NEEDLE INSUFFLATION 14GA 150MM (NEEDLE) IMPLANT
NEEDLE SPNL 22GX3.5 QUINCKE BK (NEEDLE) ×3 IMPLANT
NS IRRIG 1000ML POUR BTL (IV SOLUTION) ×4 IMPLANT
PACK LAPAROSCOPY BASIN (CUSTOM PROCEDURE TRAY) ×4 IMPLANT
PACK TRENDGUARD 450 HYBRID PRO (MISCELLANEOUS) ×3 IMPLANT
PACK VAGINAL MINOR WOMEN LF (CUSTOM PROCEDURE TRAY) ×4 IMPLANT
PAD DRESSING TELFA 3X8 NADH (GAUZE/BANDAGES/DRESSINGS) ×3 IMPLANT
PAD OB MATERNITY 4.3X12.25 (PERSONAL CARE ITEMS) ×4 IMPLANT
POUCH SPECIMEN RETRIEVAL 10MM (ENDOMECHANICALS) IMPLANT
PROTECTOR NERVE ULNAR (MISCELLANEOUS) ×6 IMPLANT
SEAL ROD LENS SCOPE MYOSURE (ABLATOR) ×4 IMPLANT
SET IRRIG TUBING LAPAROSCOPIC (IRRIGATION / IRRIGATOR) ×1 IMPLANT
SET TUBE SMOKE EVAC HIGH FLOW (TUBING) ×4 IMPLANT
SOL ANTI FOG 6CC (MISCELLANEOUS) IMPLANT
SOLUTION ANTI FOG 6CC (MISCELLANEOUS) ×1
SUT VIC AB 1 CT1 36 (SUTURE) IMPLANT
SUT VICRYL 0 UR6 27IN ABS (SUTURE) ×4 IMPLANT
SUT VICRYL RAPIDE 3 0 (SUTURE) ×4 IMPLANT
SYR 10ML LL (SYRINGE) ×2 IMPLANT
TOWEL OR 17X26 10 PK STRL BLUE (TOWEL DISPOSABLE) ×4 IMPLANT
TRAY FOLEY W/BAG SLVR 14FR LF (SET/KITS/TRAYS/PACK) ×4 IMPLANT
TRENDGUARD 450 HYBRID PRO PACK (MISCELLANEOUS)
TROCAR 12M 150ML BLUNT (TROCAR) IMPLANT
TROCAR 5M 150ML BLDLS (TROCAR) IMPLANT
TROCAR BALLN 12MMX100 BLUNT (TROCAR) IMPLANT
TROCAR BLADELESS OPT 5 100 (ENDOMECHANICALS) ×8 IMPLANT
TROCAR XCEL NON-BLD 11X100MML (ENDOMECHANICALS) ×2 IMPLANT
WARMER LAPAROSCOPE (MISCELLANEOUS) ×4 IMPLANT

## 2021-03-02 NOTE — Anesthesia Preprocedure Evaluation (Addendum)
Anesthesia Evaluation  Patient identified by MRN, date of birth, ID band Patient awake    Reviewed: Allergy & Precautions, NPO status , Patient's Chart, lab work & pertinent test results  History of Anesthesia Complications (+) PONV, PROLONGED EMERGENCE and history of anesthetic complications (requests no premeds- prolonged emergence)  Airway Mallampati: III  TM Distance: >3 FB Neck ROM: Limited   Comment: S/p ACDF 2019, had very limited neck extension as well as rotation Dental  (+) Teeth Intact, Dental Advisory Given   Pulmonary neg pulmonary ROS,    Pulmonary exam normal breath sounds clear to auscultation       Cardiovascular negative cardio ROS Normal cardiovascular exam Rhythm:Regular Rate:Normal     Neuro/Psych  Headaches, negative psych ROS   GI/Hepatic negative GI ROS, Neg liver ROS,   Endo/Other  BMI 33  Renal/GU negative Renal ROS  Female GU complaint     Musculoskeletal  (+) Arthritis , Osteoarthritis,    Abdominal   Peds  Hematology negative hematology ROS (+)   Anesthesia Other Findings Per pt had prolonged emergence after breast augmentation in 2005, and also 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 Has subsequently had no issues with 5-6 surgeries after this. Had nausea once on her way home from a surgery but nothing substantial postoperatively.   Reproductive/Obstetrics negative OB ROS Urine preg neg                            Anesthesia Physical Anesthesia Plan  ASA: 3  Anesthesia Plan: General   Post-op Pain Management: Tylenol PO (pre-op) and Toradol IV (intra-op)   Induction: Intravenous  PONV Risk Score and Plan: 4 or greater and Ondansetron, Dexamethasone, Aprepitant, Midazolam, Scopolamine patch - Pre-op and Treatment may vary due to age or medical condition  Airway Management Planned: Video Laryngoscope  Planned and Oral ETT  Additional Equipment: None  Intra-op Plan:   Post-operative Plan: Extubation in OR  Informed Consent: I have reviewed the patients History and Physical, chart, labs and discussed the procedure including the risks, benefits and alternatives for the proposed anesthesia with the patient or authorized representative who has indicated his/her understanding and acceptance.     Dental advisory given  Plan Discussed with: CRNA  Anesthesia Plan Comments:       Anesthesia Quick Evaluation

## 2021-03-02 NOTE — Anesthesia Procedure Notes (Signed)
Procedure Name: Intubation Date/Time: 03/02/2021 12:08 PM Performed by: Justice Rocher, CRNA Pre-anesthesia Checklist: Patient identified, Emergency Drugs available, Suction available, Patient being monitored and Timeout performed Patient Re-evaluated:Patient Re-evaluated prior to induction Oxygen Delivery Method: Circle system utilized Preoxygenation: Pre-oxygenation with 100% oxygen Induction Type: IV induction Ventilation: Mask ventilation without difficulty Laryngoscope Size: Mac, 3 and Glidescope Grade View: Grade II Tube type: Oral Number of attempts: 1 Airway Equipment and Method: Stylet and Oral airway Placement Confirmation: ETT inserted through vocal cords under direct vision, positive ETCO2, breath sounds checked- equal and bilateral and CO2 detector Secured at: 22 cm Tube secured with: Tape Dental Injury: Teeth and Oropharynx as per pre-operative assessment

## 2021-03-02 NOTE — Anesthesia Postprocedure Evaluation (Signed)
Anesthesia Post Note  Patient: Kenyetta M Fagerstrom  Procedure(s) Performed: Attempted HYSTEROSCOPY WITH NOVASURE ABLATION (Vagina ) LAPAROSCOPIC BILATERAL SALPINGECTOMY (Bilateral: Abdomen)     Patient location during evaluation: PACU Anesthesia Type: General Level of consciousness: awake and alert, oriented and patient cooperative Pain management: pain level controlled Vital Signs Assessment: post-procedure vital signs reviewed and stable Respiratory status: spontaneous breathing, nonlabored ventilation and respiratory function stable Cardiovascular status: blood pressure returned to baseline and stable Postop Assessment: no apparent nausea or vomiting Anesthetic complications: no   No notable events documented.  Last Vitals:  Vitals:   03/02/21 1337 03/02/21 1345  BP: 129/74 130/83  Pulse: (!) 103 94  Resp: 10 13  Temp: 36.4 C   SpO2: 100% 100%    Last Pain:  Vitals:   03/02/21 1345  TempSrc:   PainSc: 0-No pain                 Lannie Fields

## 2021-03-02 NOTE — Discharge Instructions (Addendum)
No acetaminophen/Tylenol until after 4:30pm today if needed for pain.    No ibuprofen, Advil, Aleve, Motrin, ketorolac, meloxicam, naproxen, or other NSAIDS until after 7:30pm today if needed for pain.        DISCHARGE INSTRUCTIONS: D&C / D&E The following instructions have been prepared to help you care for yourself upon your return home.   Personal hygiene:  Use sanitary pads for vaginal drainage, not tampons.  Shower the day after your procedure.  NO tub baths, pools or Jacuzzis for 2-3 weeks.  Wipe front to back after using the bathroom.  Activity and limitations:  Do NOT drive or operate any equipment for 24 hours. The effects of anesthesia are still present and drowsiness may result.  Do NOT rest in bed all day.  Walking is encouraged.  Walk up and down stairs slowly.  You may resume your normal activity in one to two days or as indicated by your physician.  Sexual activity: NO intercourse for at least 2 weeks after the procedure, or as indicated by your physician.  Diet: Eat a light meal as desired this evening. You may resume your usual diet tomorrow.  Return to work: You may resume your work activities in one to two days or as indicated by your doctor.  What to expect after your surgery: Expect to have vaginal bleeding/discharge for 2-3 days and spotting for up to 10 days. It is not unusual to have soreness for up to 1-2 weeks. You may have a slight burning sensation when you urinate for the first day. Mild cramps may continue for a couple of days. You may have a regular period in 2-6 weeks.  Call your doctor for any of the following:  Excessive vaginal bleeding, saturating and changing one pad every hour.  Inability to urinate 6 hours after discharge from hospital.  Pain not relieved by pain medication.  Fever of 100.4 F or greater.  Unusual vaginal discharge or odor.   Call for an appointment:      Post Anesthesia Home Care Instructions  Activity: Get plenty  of rest for the remainder of the day. A responsible individual must stay with you for 24 hours following the procedure.  For the next 24 hours, DO NOT: -Drive a car -Advertising copywriter -Drink alcoholic beverages -Take any medication unless instructed by your physician -Make any legal decisions or sign important papers.  Meals: Start with liquid foods such as gelatin or soup. Progress to regular foods as tolerated. Avoid greasy, spicy, heavy foods. If nausea and/or vomiting occur, drink only clear liquids until the nausea and/or vomiting subsides. Call your physician if vomiting continues.  Special Instructions/Symptoms: Your throat may feel dry or sore from the anesthesia or the breathing tube placed in your throat during surgery. If this causes discomfort, gargle with warm salt water. The discomfort should disappear within 24 hours.  If you had a scopolamine patch placed behind your ear for the management of post- operative nausea and/or vomiting:  1. The medication in the patch is effective for 72 hours, after which it should be removed.  Wrap patch in a tissue and discard in the trash. Wash hands thoroughly with soap and water. 2. You may remove the patch earlier than 72 hours if you experience unpleasant side effects which may include dry mouth, dizziness or visual disturbances. 3. Avoid touching the patch. Wash your hands with soap and water after contact with the patch.

## 2021-03-02 NOTE — Brief Op Note (Signed)
03/02/2021  1:24 PM  PATIENT:  Stacey Rios  46 y.o. female  PRE-OPERATIVE DIAGNOSIS:  abnormal uterine bleeding  POST-OPERATIVE DIAGNOSIS:  abnormal uterine bleeding  PROCEDURE:  Procedure(s) with comments: HYSTEROSCOPY WITH NOVASURE ABLATION (N/A) LAPAROSCOPIC BILATERAL SALPINGECTOMY (Bilateral) - with possible myosure in laparoscopic bilateral salpingectomy  SURGEON:  Surgeon(s) and Role:    Claiborne Billings, Mandrell Vangilder, DO - Primary   ANESTHESIA:   general and IV sedation  EBL:  20 mL   LOCAL MEDICATIONS USED:  MARCAINE    and LIDOCAINE   SPECIMEN:  Source of Specimen:  bilateral fallopian tube  FINDINGS: globular adenomatous appearance of exterior uterus, normal bilateral tubes and ovaries, left tube with 2 paratubal cysts, on hysteroscopy uterus sounded to 8cm, abnormal cavity appearance, 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  DESCRIPTION OF PROCEDURE:  see full op note  DISPOSITION OF SPECIMEN:  PATHOLOGY  COUNTS:  YES  PLAN OF CARE: Discharge to home after PACU  PATIENT DISPOSITION:  PACU - hemodynamically stable.   Delay start of Pharmacological VTE agent (>24hrs) due to surgical blood loss or risk of bleeding: no

## 2021-03-02 NOTE — Transfer of Care (Signed)
Immediate Anesthesia Transfer of Care Note  Patient: Stacey Rios  Procedure(s) Performed: Procedure(s) (LRB): Attempted HYSTEROSCOPY WITH NOVASURE ABLATION (N/A) LAPAROSCOPIC BILATERAL SALPINGECTOMY (Bilateral)  Patient Location: PACU  Anesthesia Type: General  Level of Consciousness: awake, sedated, patient cooperative and responds to stimulation  Airway & Oxygen Therapy: Patient Spontanous Breathing and Patient connected to [place] 02 and soft FM  Post-op Assessment: Report given to PACU RN, Post -op Vital signs reviewed and stable and Patient moving all extremities  Post vital signs: Reviewed and stable  Complications: No apparent anesthesia complications

## 2021-03-02 NOTE — H&P (Signed)
46 y.o. G0PO  In 2017 pt reported irregular bleeding on combined OCPs- stated periods will be regular for a few years then has needed to switch brands. Pt switched to micronor d/t elevted BPs- still with irregular bleeding and BTB - [place] 2017 normal EMS with 1cm fibroid. Mirena placed then removed d/t discomfort. Decided to go back to POP, as of 07/2017 starting to get BTB, stopped for a while then restarted 1-03/2018 and as of 07/2018 irregularity present but improving. At that time started to consider ablation procedure As of 04/2020, pt called stating missed 3 POP in Nov/2021, and has had irregular bleeding since. Since had known history of small fibroid and last [place] was 2017, repeat pelvic US performed 04/2020: uterus 5.24 x 4.75 x 4.12cm , EMS 4.31 cm, superiorly obscured d/t fibroid measuring approx 1.93 x 1.81cm (<50% submucosal).  Lysteda Rx sent while pt weighed ablation vs hysterectomy.  Declining trial of further hormonal mgmt.  Pt called back requesting ablation, discussed adding myosure if needed to shave down submucosal fibroid if impeding novasure device.  As of 12/2020 pt decided to proceed with possible hysterscopic myomectomy with Novasure ablation and laparoscopic bilateral salpingectomy.  Past Medical History:  Diagnosis Date   Complication of anesthesia    pt request not to be given pre-anesthesia medication because she will not wake up for very long time   Degenerative disc disease, lumbar    Family history of adverse reaction to anesthesia    father--- combative   History of ovarian cyst    Menorrhagia    Migraines    OA (osteoarthritis)    PONV (postoperative nausea and vomiting)    Wears contact lenses    Asthma Hypertension no meds Past Surgical History:  Procedure Laterality Date   ANTERIOR CERVICAL DECOMP/DISCECTOMY FUSION  2019   C5--6   BREAST ENHANCEMENT SURGERY Bilateral 2005   KNEE ARTHROSCOPY Bilateral 2012   TEMPOROMANDIBULAR JOINT SURGERY Bilateral 1995   WRIST  SURGERY Bilateral    2012  left wrist tendon repair;/   2015  right wrist tendon repair, ganglion cyst excision, graft    Social History   Socioeconomic History   Marital status: Single    Spouse name: Not on file   Number of children: Not on file   Years of education: Not on file   Highest education level: Not on file  Occupational History   Not on file  Tobacco Use   Smoking status: Never   Smokeless tobacco: Never  Vaping Use   Vaping Use: Every day   Substances: THC, CBD   Devices: unsure  Substance and Sexual Activity   Alcohol use: Yes   Drug use: Not Currently    Types: Marijuana   Sexual activity: Not on file  Other Topics Concern   Not on file  Social History Narrative   Not on file   Social Determinants of Health   Financial Resource Strain: Not on file  Food Insecurity: Not on file  Transportation Needs: Not on file  Physical Activity: Not on file  Stress: Not on file  Social Connections: Not on file  Intimate Partner Violence: Not on file    No current facility-administered medications on file prior to encounter.   Current Outpatient Medications on File Prior to Encounter  Medication Sig Dispense Refill   Ascorbic Acid (VITA-C PO) Take by mouth daily at 12 noon.     ASHWAGANDHA PO Take by mouth daily.     b complex vitamins capsule  Take 1 capsule by mouth daily.     BIOTIN PO Take by mouth daily.     Cholecalciferol (VITAMIN D3 PO) Take by mouth daily.     Ginger, Zingiber officinalis, (GINGER ROOT PO) Take by mouth daily.     ibuprofen (ADVIL) 200 MG tablet Take 200 mg by mouth every 6 (six) hours as needed.     imipramine (TOFRANIL) 25 MG tablet Take 50 mg by mouth daily.     MAGNESIUM PO Take by mouth daily.     MILK THISTLE PO Take by mouth daily at 12 noon.     Misc Natural Products (MAGIC MUSHROOM MIX) CAPS Take by mouth daily. Mushroom complex     Multiple Vitamins-Minerals (ZINC PO) Take by mouth daily.     Omega-3 Fatty Acids (FISH OIL PO)  Take by mouth daily.     PINE BARK, PYCNOGENOL, PO Take by mouth daily.     Probiotic Product (PROBIOTIC PO) Take by mouth daily.     VITAMIN E PO Take by mouth daily.     TURMERIC PO Take by mouth daily at 12 noon. (Patient not taking: Reported on 02/24/2021)      Allergies  Allergen Reactions   Ibuprofen     High doses    Vitals:   02/24/21 1652 03/02/21 1036  BP:  (!) 156/103  Pulse:  90  Resp:  17  Temp:  98.1 F (36.7 C)  TempSrc:  Oral  SpO2:  100%  Weight: 79.4 kg 81.6 kg  Height: 5\' 1"  (1.549 m) 5\' 1"  (1.549 m)   Per last office visit: Lungs: clear to ascultation Cor:  RRR Abdomen:  soft, nontender, nondistended. Ex:  no cords, erythema Pelvic:  deferred to OR  A:  Abnormal uterine bleeding, heavy with desired permanent sterilization   P: All risks, benefits and alternatives d/w patient and she desires to proceed with laparoscopic bilateral salpingectomy with hysteroscopy, possible myosure myomectomy and Novasure endometrial ablation. Routine pre-op care, no antibiotics needed Allyn Kenner

## 2021-03-02 NOTE — Op Note (Signed)
NAME: Stacey Rios, Stacey Rios MEDICAL RECORD NO: [id number] ACCOUNT NO: [id number] DATE OF BIRTH: [birth year] FACILITY: [facility] LOCATION: [facility] PHYSICIAN: Virgina Evener. Claiborne Billings, DO  Operative Report   DATE OF PROCEDURE: 03/02/2021  PREOPERATIVE DIAGNOSIS:  Desired permanent sterilization and abnormal uterine bleeding.  POSTOPERATIVE DIAGNOSIS:  Desired permanent sterilization and abnormal uterine bleeding.  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  PROCEDURE:  Laparoscopic bilateral salpingectomy with hysteroscopy, possible NovaSure, possible MyoSure, NovaSure and MyoSure were not performed.  SURGEON: Virgina Evener. Claiborne Billings, DO  ASSISTANT SURGEON: Carrington Clamp, MD  ANESTHESIA:  General and local.  ESTIMATED BLOOD LOSS:  Minimal, less than 20 mL.  Local medications 0.25% Marcaine subcutaneously for abdominal incisions, 1% lidocaine, 10 mL for paracervical block.  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  On hysteroscopy, the uterus was sounded to 8 cm and when the camera was advanced, the cavity  appeared normal where the left tubal ostia was difficult to see, but there was an indentation on that side.  The right side appeared to have a large opening that was possibly a rudimentary horn that turned out such a hard angle that it was impossible to  advance the camera to follow that narrow canal with the wide opening to its terminus.   COMPLICATIONS:  None.  CONDITION:  Stable to PACU.  DESCRIPTION OF PROCEDURE:  The patient was taken to the operating room where anesthesia was administered and found to be adequate.  The patient was prepped and draped in the normal sterile fashion in dorsal lithotomy position.  A speculum was placed in  the vagina after urinary catheter was placed.  The cervix was visualized and a Hulka uterine manipulator advanced and grasped to  the anterior cervix.  The speculum was then removed and attention was turned to the abdomen.  An 11 mm horizontal  infraumbilical incision was made after Marcaine was placed.  An Optiview trocar was advanced and found to be tracking in the subcutaneous fat, it was removed and advanced 2 additional times. At that point, a Veress needle was used to puncture the  peritoneum.  Gas was placed on low flow, and the abdomen was insufflated.  At that point, the Optiview was advanced and penetrated the peritoneum without difficulty.  The abdomen was insufflated, and 5 mm trocars were advanced through 5 mm incisions  where Marcaine had been placed.  Both trocars were advanced under direct observation.  At that point, the uterus was examined, findings noted above.  Both tubes and ovaries appeared normal.  The LigaSure was used to transect the mesosalpinx and transect  the tube where it joins the uterus.  The same was performed on the opposite side and both tubes were removed under direct observation and sent to pathology.  The abdomen was surveyed with the camera and found to be normal.  All instruments were then  removed and the abdomen was desufflated.  The fascia at the umbilicus was grasped with a Kocher clamp and a 2-0 Vicryl was used to make 1 interrupted suture to reapproximate and close the fascia.  This was confirmed manually.  All 3 abdominal incisions  were closed subcuticularly with Vicryl.  Attention was then turned to the pelvic portion of the case where the Hulka uterine manipulator was removed and a tenaculum used to grasp the anterior rim of the cervix.  The cervix was dilated without difficulty  to [address]  and the hysteroscope was advanced with findings above.  The findings were unusual and difficult to describe.  Unfortunately, based on those findings, the NovaSure had to be abandoned.  No submucosal fibroids were noted requiring removal with  MyoSure.  The fluid deficit was 150 mL. The hysteroscope  was removed and tenaculum removed.  No further bleeding was noted.  The speculum was removed followed by the Foley catheter.  The patient tolerated the procedure well.  Sponge, lap and needle  counts were correct x2.  The patient was taken to recovery in stable condition.     SUJ D: 03/02/2021 1:40:07 pm T: 03/02/2021 11:39:00 pm  JOB: [id number]/ [id number]

## 2021-03-06 ENCOUNTER — Encounter (HOSPITAL_BASED_OUTPATIENT_CLINIC_OR_DEPARTMENT_OTHER): Payer: Self-pay | Admitting: Obstetrics and Gynecology

## 2021-03-06 LAB — SURGICAL PATHOLOGY

## 2021-09-19 ENCOUNTER — Ambulatory Visit
Admission: EM | Admit: 2021-09-19 | Discharge: 2021-09-19 | Disposition: A | Discharge status: Home / Self Care | Payer: BC Managed Care – PPO | Attending: Physician Assistant | Admitting: Physician Assistant

## 2021-09-19 ENCOUNTER — Encounter: Payer: Self-pay | Admitting: Emergency Medicine

## 2021-09-19 DIAGNOSIS — J069 Acute upper respiratory infection, unspecified: Secondary | ICD-10-CM

## 2021-09-19 DIAGNOSIS — J020 Streptococcal pharyngitis: Secondary | ICD-10-CM | POA: Diagnosis not present

## 2021-09-19 LAB — POCT RAPID STREP A (OFFICE): Rapid Strep A Screen: POSITIVE — AB

## 2021-09-19 MED ORDER — AMOXICILLIN 500 MG PO CAPS: 500.0000 mg | ORAL_CAPSULE | Freq: Three times a day (TID) | ORAL | 0 refills | Status: DC

## 2021-09-19 NOTE — ED Triage Notes (Signed)
Pt is present today with c/o nasal congestion, body aches, and sore throat. Pt sx started last night

## 2021-09-19 NOTE — ED Provider Notes (Signed)
[name]    [name]: [id number] Arrival date & time: 09/19/21  0950      History   Chief Complaint Chief Complaint  Patient presents with   Sore Throat   Generalized Body Aches   Nasal Congestion    HPI Stacey Rios is a 46 y.o. female.   Patient here today for evaluation of sore throat, body aches, nasal congestion and mild cough from drainage that started this morning. 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 She denies known tick bite but states there was never a tick bite found prior to previous diagnosis of RMSF. She has tried tylenol and ibuprofen without significant relief.   The history is provided by the patient.  Sore Throat Pertinent negatives include no abdominal pain and no shortness of breath.    Past Medical History:  Diagnosis Date   Complication of anesthesia    pt request not to be given pre-anesthesia medication because she will not wake up for very long time   Degenerative disc disease, lumbar    Family history of adverse reaction to anesthesia    father--- combative   History of ovarian cyst    Menorrhagia    Migraines    OA (osteoarthritis)    PONV (postoperative nausea and vomiting)    Wears contact lenses     There are no problems to display for this patient.   Past Surgical History:  Procedure Laterality Date   ANTERIOR CERVICAL DECOMP/DISCECTOMY FUSION  2019   C5--6   BREAST ENHANCEMENT SURGERY Bilateral 2005   DILITATION & CURRETTAGE/HYSTROSCOPY WITH NOVASURE ABLATION N/A 03/02/2021   Procedure: Attempted HYSTEROSCOPY WITH NOVASURE ABLATION;  Surgeon: Philip Aspen, DO;  Location: [facility];  Service: Gynecology;  Laterality: N/A;   KNEE ARTHROSCOPY Bilateral 2012   LAPAROSCOPIC BILATERAL SALPINGECTOMY Bilateral 03/02/2021   Procedure: LAPAROSCOPIC BILATERAL SALPINGECTOMY;  Surgeon: Philip Aspen, DO;  Location: [facility];   Service: Gynecology;  Laterality: Bilateral;  with possible myosure in laparoscopic bilateral salpingectomy   TEMPOROMANDIBULAR JOINT SURGERY Bilateral 1995   WRIST SURGERY Bilateral    2012  left wrist tendon repair;/   2015  right wrist tendon repair, ganglion cyst excision, graft    OB History   No obstetric history on file.      Home Medications    Prior to Admission medications   Medication Sig Start Date End Date Taking? Authorizing Provider  amoxicillin (AMOXIL) 500 MG capsule Take 1 capsule (500 mg total) by mouth 3 (three) times daily. 09/19/21  Yes Tomi Bamberger, PA-C  Ascorbic Acid (VITA-C PO) Take by mouth daily at 12 noon.    [provider]  ASHWAGANDHA PO Take by mouth daily.    [provider]  b complex vitamins capsule Take 1 capsule by mouth daily.    [provider]  BIOTIN PO Take by mouth daily.    [provider]  Cholecalciferol (VITAMIN D3 PO) Take by mouth daily.    [provider]  Ginger, Zingiber officinalis, (GINGER ROOT PO) Take by mouth daily.    [provider]  imipramine (TOFRANIL) 25 MG tablet Take 50 mg by mouth daily.    [provider]  MAGNESIUM PO Take by mouth daily.    [provider]  MILK THISTLE PO Take by mouth daily at 12 noon.    [provider]  Misc Natural Products [facility]  MUSHROOM MIX) CAPS Take by mouth daily. Mushroom complex    [provider]  Multiple Vitamins-Minerals (ZINC PO) Take by mouth daily.    [provider]  Omega-3 Fatty Acids (FISH OIL PO) Take by mouth daily.    [provider]  oxyCODONE (OXY IR/ROXICODONE) 5 MG immediate release tablet Take 1 tablet (5 mg total) by mouth every 6 (six) hours as needed for severe pain or moderate pain. 03/02/21   Philip Aspen, DO  PINE BARK, PYCNOGENOL, PO Take by mouth daily.    [provider]  Probiotic Product (PROBIOTIC PO) Take by mouth daily.    [provider]  TURMERIC PO Take by mouth daily at 12 noon. Patient not taking: Reported on 02/24/2021    [provider]  VITAMIN E PO Take by mouth daily.    [provider]    Family History History reviewed. No pertinent family history.  Social History Social History   Tobacco Use   Smoking status: Never   Smokeless tobacco: Never  Vaping Use   Vaping Use: Every day   Substances: THC, CBD   Devices: unsure  Substance Use Topics   Alcohol use: Yes   Drug use: Not Currently    Types: Marijuana     Allergies   Ibuprofen   Review of Systems Review of Systems  Constitutional:  Negative for chills and fever.  HENT:  Positive for congestion and sore throat. Negative for ear pain.   Eyes:  Negative for discharge and redness.  Respiratory:  Positive for cough. Negative for shortness of breath and wheezing.   Gastrointestinal:  Negative for abdominal pain, diarrhea, nausea and vomiting.  Musculoskeletal:  Positive for myalgias.     Physical Exam Triage Vital Signs ED Triage Vitals [09/19/21 1007]  Enc Vitals Group     BP (!) 133/90     Pulse Rate 92     Resp 18     Temp 98.2 F (36.8 C)     Temp src      SpO2 98 %     Weight      Height      Head Circumference      Peak Flow      Pain Score 9     Pain Loc      Pain Edu?      Excl. in GC?    No data found.  Updated Vital Signs BP (!) 133/90   Pulse 92   Temp 98.2 F (36.8 C)   Resp 18   SpO2 98%     Physical Exam Vitals and nursing note reviewed.  Constitutional:      General: She is not in acute distress.    Appearance: Normal appearance. She is not ill-appearing.  HENT:     Head: Normocephalic and atraumatic.     Nose: Congestion present.     Mouth/Throat:     Mouth: Mucous membranes are moist.     Pharynx: Posterior oropharyngeal erythema present. No oropharyngeal exudate.  Eyes:     Conjunctiva/sclera: Conjunctivae normal.  Cardiovascular:     Rate and Rhythm: Normal  rate and regular rhythm.     Heart sounds: Normal heart sounds. No murmur heard. Pulmonary:     Effort: Pulmonary effort is normal. No respiratory distress.     Breath sounds: Normal breath sounds. No wheezing, rhonchi or rales.  Skin:    General: Skin is warm and dry.  Neurological:     Mental  Status: She is alert.  Psychiatric:        Mood and Affect: Mood normal.        Thought Content: Thought content normal.      UC Treatments / Results  Labs (all labs ordered are listed, but only abnormal results are displayed) Labs Reviewed  POCT RAPID STREP A (OFFICE) - Abnormal; Notable for the following components:      Result Value   Rapid Strep A Screen Positive (*)    All other components within normal limits  NOVEL CORONAVIRUS, NAA  ROCKY MTN SPOTTED FVR ABS PNL(IGG+IGM)    EKG   Radiology No results found.  Procedures Procedures (including critical care time)  Medications Ordered in UC Medications - No data to display  Initial Impression / Assessment and Plan / UC Course  I have reviewed the triage vital signs and the nursing notes.  Pertinent labs & imaging results that were available during my care of the patient were reviewed by me and considered in my medical decision making (see chart for details).    Strep test positive. Will screen for covid and RMSF at patient request. Amoxicillin prescribed. Encouraged follow up with any further concerns.   Final Clinical Impressions(s) / UC Diagnoses   Final diagnoses:  Acute upper respiratory infection  Streptococcal sore throat   Discharge Instructions   None    ED Prescriptions     Medication Sig Dispense Auth. Provider   amoxicillin (AMOXIL) 500 MG capsule Take 1 capsule (500 mg total) by mouth 3 (three) times daily. 21 capsule Tomi Bamberger, PA-C      PDMP not reviewed this encounter.   Tomi Bamberger, PA-C 09/19/21 1056

## 2021-09-20 LAB — NOVEL CORONAVIRUS, NAA: SARS-CoV-2, NAA: NOT DETECTED

## 2021-09-21 LAB — ROCKY MTN SPOTTED FVR ABS PNL(IGG+IGM)
RMSF IgG: NEGATIVE
RMSF IgM: 1.38 index — ABNORMAL HIGH (ref 0.00–0.89)

## 2021-09-22 ENCOUNTER — Telehealth (HOSPITAL_COMMUNITY): Payer: Self-pay | Admitting: Emergency Medicine

## 2021-09-22 MED ORDER — ONDANSETRON HCL 4 MG PO TABS: 4.0000 mg | ORAL_TABLET | Freq: Four times a day (QID) | ORAL | 0 refills | Status: DC

## 2021-09-22 MED ORDER — DOXYCYCLINE HYCLATE 100 MG PO CAPS: 100.0000 mg | ORAL_CAPSULE | Freq: Two times a day (BID) | ORAL | 0 refills | Status: DC

## 2021-09-23 ENCOUNTER — Telehealth: Payer: Self-pay | Admitting: Emergency Medicine

## 2021-09-23 MED ORDER — ONDANSETRON HCL 4 MG PO TABS: 4.0000 mg | ORAL_TABLET | Freq: Four times a day (QID) | ORAL | 0 refills | Status: DC

## 2021-09-23 MED ORDER — DOXYCYCLINE HYCLATE 100 MG PO CAPS: 100.0000 mg | ORAL_CAPSULE | Freq: Two times a day (BID) | ORAL | 0 refills | Status: AC

## 2021-10-07 ENCOUNTER — Ambulatory Visit
Admission: RE | Admit: 2021-10-07 | Discharge: 2021-10-07 | Disposition: A | Discharge status: Home / Self Care | Payer: BC Managed Care – PPO | Source: Ambulatory Visit | Attending: Physician Assistant | Admitting: Physician Assistant

## 2021-10-07 VITALS — BP 154/105 | HR 102 | Temp 98.4°F | Resp 20

## 2021-10-07 DIAGNOSIS — R52 Pain, unspecified: Secondary | ICD-10-CM | POA: Diagnosis present

## 2021-10-07 DIAGNOSIS — R6883 Chills (without fever): Secondary | ICD-10-CM | POA: Diagnosis not present

## 2021-10-07 DIAGNOSIS — R5381 Other malaise: Secondary | ICD-10-CM

## 2021-10-07 DIAGNOSIS — A770 Spotted fever due to Rickettsia rickettsii: Secondary | ICD-10-CM | POA: Diagnosis not present

## 2021-10-07 DIAGNOSIS — R5383 Other fatigue: Secondary | ICD-10-CM | POA: Diagnosis present

## 2021-10-07 DIAGNOSIS — U071 COVID-19: Secondary | ICD-10-CM | POA: Diagnosis not present

## 2021-10-07 MED ORDER — DOXYCYCLINE HYCLATE 100 MG PO CAPS: 100.0000 mg | ORAL_CAPSULE | Freq: Two times a day (BID) | ORAL | 0 refills | Status: DC

## 2021-10-07 MED ORDER — ONDANSETRON 4 MG PO TBDP
4.0000 mg | ORAL_TABLET | Freq: Three times a day (TID) | ORAL | 0 refills | Status: AC | PRN
Start: 2021-10-07 — End: ?

## 2021-10-07 NOTE — ED Provider Notes (Signed)
[name]    [name]: [id number] Arrival date & time: 10/07/21  0809      History   Chief Complaint Chief Complaint  Patient presents with   Generalized Body Aches   Fever   Headache    HPI Stacey Rios is a 46 y.o. female.   Patient presents today with a 2-week history of widespread body aches.  Reports that she was seen in our clinic on 09/19/2021 at which point she tested positive for strep pharyngitis.  She was treated with amoxicillin but also requested [facility] spotted fever titers as she had a history of RMSF with similar presentation.  These titers were positive for IgM and negative for IgG so she was treated with a course of doxycycline.  She reports that symptoms resolved momentarily following this medication but recurred a few days ago with the same symptoms.  She denies any known tick bites but reports that she has been positive in the past without known tick bite.  She does report exposure as she has been working outside.  She denies any known sick contacts or exposure to COVID-19.  She does report an episode of nausea and vomiting but denies additional symptoms including congestion, cough, fever, chest pain, shortness of breath.  Denies any medication changes or statin use.  She denies any change in activity or extreme heat exposure.  She reports that body pain is rated 8 on a 0-10 pain scale, generalized throughout her body, no aggravating or alleviating factors notified.  She reports that when she has had [facility] spotted fever in the past she required a months of antibiotics and believes that she needs a longer course of doxycycline.    Past Medical History:  Diagnosis Date   Complication of anesthesia    pt request not to be given pre-anesthesia medication because she will not wake up for very long time   Degenerative disc disease, lumbar    Family history of adverse reaction to anesthesia    father--- combative   History of ovarian cyst     Menorrhagia    Migraines    OA (osteoarthritis)    PONV (postoperative nausea and vomiting)    Wears contact lenses     There are no problems to display for this patient.   Past Surgical History:  Procedure Laterality Date   ANTERIOR CERVICAL DECOMP/DISCECTOMY FUSION  2019   C5--6   BREAST ENHANCEMENT SURGERY Bilateral 2005   DILITATION & CURRETTAGE/HYSTROSCOPY WITH NOVASURE ABLATION N/A 03/02/2021   Procedure: Attempted HYSTEROSCOPY WITH NOVASURE ABLATION;  Surgeon: Philip Aspen, DO;  Location: [facility];  Service: Gynecology;  Laterality: N/A;   KNEE ARTHROSCOPY Bilateral 2012   LAPAROSCOPIC BILATERAL SALPINGECTOMY Bilateral 03/02/2021   Procedure: LAPAROSCOPIC BILATERAL SALPINGECTOMY;  Surgeon: Philip Aspen, DO;  Location: [facility];  Service: Gynecology;  Laterality: Bilateral;  with possible myosure in laparoscopic bilateral salpingectomy   TEMPOROMANDIBULAR JOINT SURGERY Bilateral 1995   WRIST SURGERY Bilateral    2012  left wrist tendon repair;/   2015  right wrist tendon repair, ganglion cyst excision, graft    OB History   No obstetric history on file.      Home Medications    Prior to Admission medications   Medication Sig Start Date End Date Taking? Authorizing Provider  doxycycline (VIBRAMYCIN) 100 MG capsule Take 1 capsule (100 mg total) by mouth 2 (two) times daily. 10/07/21  Yes Ayan Heffington K, PA-C  ondansetron (ZOFRAN-ODT) 4 MG  disintegrating tablet Take 1 tablet (4 mg total) by mouth every 8 (eight) hours as needed for nausea or vomiting. 10/07/21  Yes Jaleiyah Alas K, PA-C  Ascorbic Acid (VITA-C PO) Take by mouth daily at 12 noon.    [provider]  ASHWAGANDHA PO Take by mouth daily.    [provider]  b complex vitamins capsule Take 1 capsule by mouth daily.    [provider]  BIOTIN PO Take by mouth daily.    [provider]  Cholecalciferol (VITAMIN D3 PO) Take by mouth daily.     [provider]  Ginger, Zingiber officinalis, (GINGER ROOT PO) Take by mouth daily.    [provider]  imipramine (TOFRANIL) 25 MG tablet Take 50 mg by mouth daily.    [provider]  MAGNESIUM PO Take by mouth daily.    [provider]  MILK THISTLE PO Take by mouth daily at 12 noon.    [provider]  Misc Natural Products (MAGIC MUSHROOM MIX) CAPS Take by mouth daily. Mushroom complex    [provider]  Multiple Vitamins-Minerals (ZINC PO) Take by mouth daily.    [provider]  Omega-3 Fatty Acids (FISH OIL PO) Take by mouth daily.    [provider]  oxyCODONE (OXY IR/ROXICODONE) 5 MG immediate release tablet Take 1 tablet (5 mg total) by mouth every 6 (six) hours as needed for severe pain or moderate pain. 03/02/21   Philip Aspen, DO  PINE BARK, PYCNOGENOL, PO Take by mouth daily.    [provider]  Probiotic Product (PROBIOTIC PO) Take by mouth daily.    [provider]  TURMERIC PO Take by mouth daily at 12 noon. Patient not taking: Reported on 02/24/2021    [provider]  VITAMIN E PO Take by mouth daily.    [provider]    Family History History reviewed. No pertinent family history.  Social History Social History   Tobacco Use   Smoking status: Never   Smokeless tobacco: Never  Vaping Use   Vaping Use: Every day   Substances: THC, CBD   Devices: unsure  Substance Use Topics   Alcohol use: Yes   Drug use: Not Currently    Types: Marijuana     Allergies   Ibuprofen   Review of Systems Review of Systems  Constitutional:  Positive for activity change, chills, diaphoresis and fatigue. Negative for appetite change and fever.  HENT:  Negative for congestion, sinus pressure, sneezing and sore throat.   Respiratory:  Negative for cough and shortness of breath.   Cardiovascular:  Negative for chest pain.  Gastrointestinal:  Positive for nausea and  vomiting. Negative for abdominal pain and diarrhea.  Musculoskeletal:  Positive for arthralgias and myalgias.  Neurological:  Negative for dizziness, light-headedness and headaches.     Physical Exam Triage Vital Signs ED Triage Vitals  Enc Vitals Group     BP      Pulse      Resp      Temp      Temp src      SpO2      Weight      Height      Head Circumference      Peak Flow      Pain Score      Pain Loc      Pain Edu?      Excl. in GC?    No data found.  Updated Vital Signs BP (!) 154/105   Pulse (!) 102   Temp 98.4 F (36.9 C)   Resp 20   SpO2 98%   Visual Acuity Right Eye Distance:   Left Eye Distance:   Bilateral Distance:    Right Eye Near:   Left Eye Near:    Bilateral Near:     Physical Exam Vitals reviewed.  Constitutional:      General: She is awake. She is not in acute distress.    Appearance: Normal appearance. She is well-developed. She is not ill-appearing.     Comments: Very pleasant female appears stated age in no acute distress sitting comfortably in exam room  HENT:     Head: Normocephalic and atraumatic.     Right Ear: Tympanic membrane, ear canal and external ear normal. Tympanic membrane is not erythematous or bulging.     Left Ear: Tympanic membrane, ear canal and external ear normal. Tympanic membrane is not erythematous or bulging.     Nose:     Right Sinus: No maxillary sinus tenderness or frontal sinus tenderness.     Left Sinus: No maxillary sinus tenderness or frontal sinus tenderness.     Mouth/Throat:     Pharynx: Uvula midline. No oropharyngeal exudate or posterior oropharyngeal erythema.  Cardiovascular:     Rate and Rhythm: Normal rate and regular rhythm.     Heart sounds: Normal heart sounds, S1 normal and S2 normal. No murmur heard. Pulmonary:     Effort: Pulmonary effort is normal.     Breath sounds: Normal breath sounds. No wheezing, rhonchi or rales.     Comments: Clear to auscultation bilaterally Abdominal:      General: Bowel sounds are normal.     Palpations: Abdomen is soft.     Tenderness: There is no abdominal tenderness.  Musculoskeletal:     Comments: Strength 5/5 bilateral upper and lower extremities  Lymphadenopathy:     Head:     Right side of head: No submental, submandibular or tonsillar adenopathy.     Left side of head: No submental, submandibular or tonsillar adenopathy.     Cervical: No cervical adenopathy.  Skin:    Findings: No rash.     Comments: No rash or skin abnormalities noted.  Psychiatric:        Behavior: Behavior is cooperative.      UC Treatments / Results  Labs (all labs ordered are listed, but only abnormal results are displayed) Labs Reviewed  SARS CORONAVIRUS 2 (TAT 6-24 HRS)  CBC WITH DIFFERENTIAL/PLATELET  COMPREHENSIVE METABOLIC PANEL  CK  ROCKY MTN SPOTTED FVR ABS PNL(IGG+IGM)    EKG   Radiology No results found.  Procedures Procedures (including critical care time)  Medications Ordered in UC Medications - No data to display  Initial Impression / Assessment and Plan / UC Course  I have reviewed the triage vital signs and the nursing notes.  Pertinent labs & imaging results that were available during my care of the patient were reviewed by me and considered in my medical decision making (see chart for details).     Unclear etiology of symptoms.  Patient has had recommended spotted fever reportedly 2 times in the past but only has IgM positive.  We will repeat titers today.  It is appropriate to extend doxycycline course given clinical presentation so we will send an additional 2 weeks of medication.  Patient also requested Zofran as doxycycline causes her to have nausea.  This was sent to  pharmacy as well.  Labs including CBC, CMP, CK were obtained.  Also tested patient for COVID.  Discussed that she should follow-up closely with her primary care provider as she may need rheumatological work-up if her symptoms persist/do not improve.  She  is to rest and drink plenty of fluids.  Discussed that if she has any worsening symptoms including increased pain, weakness, fever, nausea, vomiting, chest pain, shortness of breath she needs to go to the emergency room immediately to which she expressed understanding.  Strict return precautions given.  Final Clinical Impressions(s) / UC Diagnoses   Final diagnoses:  Generalized body aches  Malaise and fatigue  Chills  RMSF [facility] spotted fever)     Discharge Instructions      We will extend her course of doxycycline.  Please start this twice daily for 2 weeks.  Stay out of the sun while on this medication.  Use Zofran as needed for nausea and vomiting.  Make sure that you are resting and drinking plenty of fluid.  Avoid prolonged heat exposure.  We will contact you with any of your other lab results if they are abnormal.  If you continue to have symptoms I do recommend that you follow-up with your primary care provider as you may need more extensive testing to figure out why you are continuing to have the symptoms.  If you have any worsening symptoms including chest pain, shortness of breath, weakness, fever, nausea, vomiting you need to go to the emergency room immediately.     ED Prescriptions     Medication Sig Dispense Auth. Provider   doxycycline (VIBRAMYCIN) 100 MG capsule Take 1 capsule (100 mg total) by mouth 2 (two) times daily. 28 capsule Fremon Zacharia K, PA-C   ondansetron (ZOFRAN-ODT) 4 MG disintegrating tablet Take 1 tablet (4 mg total) by mouth every 8 (eight) hours as needed for nausea or vomiting. 20 tablet Vieva Brummitt, Noberto Retort, PA-C      PDMP not reviewed this encounter.   Jeani Hawking, PA-C 10/07/21 [phone]

## 2021-10-07 NOTE — ED Triage Notes (Signed)
Pt presents to uc with co of body aches, chills, fevers, ha. Pt st she had [facility] spotted fever and strep throat 2 weeks ago and has completed the round of doxycyline and did begin to feel better but symptoms returned yesterday. Pt reports she did have this 2 years ago and required 30 days of doxycycline. Pt reports she did find another tick crawling on her last week but no bite.

## 2021-10-07 NOTE — Discharge Instructions (Signed)
We will extend her course of doxycycline.  Please start this twice daily for 2 weeks.  Stay out of the sun while on this medication.  Use Zofran as needed for nausea and vomiting.  Make sure that you are resting and drinking plenty of fluid.  Avoid prolonged heat exposure.  We will contact you with any of your other lab results if they are abnormal.  If you continue to have symptoms I do recommend that you follow-up with your primary care provider as you may need more extensive testing to figure out why you are continuing to have the symptoms.  If you have any worsening symptoms including chest pain, shortness of breath, weakness, fever, nausea, vomiting you need to go to the emergency room immediately.

## 2021-10-08 LAB — SARS CORONAVIRUS 2 (TAT 6-24 HRS): SARS Coronavirus 2: POSITIVE — AB

## 2021-10-10 LAB — COMPREHENSIVE METABOLIC PANEL
ALT: 19 IU/L (ref 0–32)
AST: 14 IU/L (ref 0–40)
Albumin/Globulin Ratio: 1.7 (ref 1.2–2.2)
Albumin: 4.5 g/dL (ref 3.9–4.9)
Alkaline Phosphatase: 75 IU/L (ref 44–121)
BUN/Creatinine Ratio: 13 (ref 9–23)
BUN: 12 mg/dL (ref 6–24)
Bilirubin Total: <0.2 mg/dL (ref 0.0–1.2)
CO2: 21 mmol/L (ref 20–29)
Calcium: 9.2 mg/dL (ref 8.7–10.2)
Chloride: 100 mmol/L (ref 96–106)
Creatinine, Ser: 0.96 mg/dL (ref 0.57–1.00)
Globulin, Total: 2.6 g/dL (ref 1.5–4.5)
Glucose: 94 mg/dL (ref 70–99)
Potassium: 4 mmol/L (ref 3.5–5.2)
Sodium: 134 mmol/L (ref 134–144)
Total Protein: 7.1 g/dL (ref 6.0–8.5)
eGFR: 74 mL/min/{1.73_m2} (ref >59)

## 2021-10-10 LAB — CBC WITH DIFFERENTIAL/PLATELET
Basophils Absolute: 0.1 10*3/uL (ref 0.0–0.2)
Basos: 1 %
EOS (ABSOLUTE): 0 10*3/uL (ref 0.0–0.4)
Eos: 1 %
Hematocrit: 39.1 % (ref 34.0–46.6)
Hemoglobin: 12.4 g/dL (ref 11.1–15.9)
Immature Grans (Abs): 0 10*3/uL (ref 0.0–0.1)
Immature Granulocytes: 0 %
Lymphocytes Absolute: 1 10*3/uL (ref 0.7–3.1)
Lymphs: 18 %
MCH: 28.7 pg (ref 26.6–33.0)
MCHC: 31.7 g/dL (ref 31.5–35.7)
MCV: 91 fL (ref 79–97)
Monocytes Absolute: 0.8 10*3/uL (ref 0.1–0.9)
Monocytes: 13 %
Neutrophils Absolute: 3.9 10*3/uL (ref 1.4–7.0)
Neutrophils: 67 %
Platelets: 404 10*3/uL (ref 150–450)
RBC: 4.32 x10E6/uL (ref 3.77–5.28)
RDW: 12.4 % (ref 11.7–15.4)
WBC: 5.8 10*3/uL (ref 3.4–10.8)

## 2021-10-10 LAB — ROCKY MTN SPOTTED FVR ABS PNL(IGG+IGM)
RMSF IgG: NEGATIVE
RMSF IgM: 1.25 index — ABNORMAL HIGH (ref 0.00–0.89)

## 2021-10-10 LAB — CK: Total CK: 52 U/L (ref 32–182)

## 2021-11-03 ENCOUNTER — Emergency Department (HOSPITAL_COMMUNITY): Payer: BC Managed Care – PPO

## 2021-11-03 ENCOUNTER — Emergency Department (HOSPITAL_COMMUNITY)
Admission: EM | Admit: 2021-11-03 | Discharge: 2021-11-04 | Disposition: A | Discharge status: Home / Self Care | Payer: BC Managed Care – PPO | Attending: Emergency Medicine | Admitting: Emergency Medicine

## 2021-11-03 ENCOUNTER — Encounter (HOSPITAL_COMMUNITY): Payer: Self-pay

## 2021-11-03 DIAGNOSIS — S93492A Sprain of other ligament of left ankle, initial encounter: Secondary | ICD-10-CM

## 2021-11-03 DIAGNOSIS — M25572 Pain in left ankle and joints of left foot: Secondary | ICD-10-CM | POA: Diagnosis present

## 2021-11-03 DIAGNOSIS — X501XXA Overexertion from prolonged static or awkward postures, initial encounter: Secondary | ICD-10-CM | POA: Diagnosis not present

## 2021-11-03 NOTE — ED Triage Notes (Signed)
Patient arrived after missing a step on her porch and rolling her left ankle. Swelling noted. Unable to bear weight on extremity.

## 2021-11-04 DIAGNOSIS — S93492A Sprain of other ligament of left ankle, initial encounter: Secondary | ICD-10-CM | POA: Diagnosis not present

## 2021-11-04 MED ORDER — OXYCODONE-ACETAMINOPHEN 5-325 MG PO TABS
2.0000 | ORAL_TABLET | Freq: Once | ORAL | Status: AC
  Administered 2021-11-04: 2 via ORAL
  Filled 2021-11-04: qty 2

## 2021-11-04 NOTE — ED Provider Notes (Signed)
[facility] Emergency Department Provider Note MRN:  [id number]  Arrival date & time: 11/04/21     Chief Complaint   Ankle Pain   History of Present Illness   Stacey Rios is a 46 y.o. year-old female presents to the ED with chief complaint of left ankle pain.  She states that she missed a step on her porch and rolled her left ankle.  She reports immediate swelling.  She is been unable to bear weight.  Denies any treatments prior to arrival..  History provided by patient.   Review of Systems  Pertinent positive and negative review of systems noted in HPI.    Physical Exam   Vitals:   11/03/21 2344  BP: 128/84  Pulse: 100  Resp: 18  Temp: 98.3 F (36.8 C)  SpO2: 100%    CONSTITUTIONAL:  well-appearing, NAD NEURO:  Alert and oriented x 3, CN 3-12 grossly intact EYES:  eyes equal and reactive ENT/NECK:  Supple, no stridor  CARDIO:  appears well-perfused, intact distal pulses, brisk cap refill in toes PULM:  No respiratory distress,  GI/GU:  non-distended,  MSK/SPINE: Decreased range of motion left ankle, able to wiggle toes, swelling noted about the left lateral ankle SKIN:  no rash, atraumatic, minor abrasion to left lateral ankle   *Additional and/or pertinent findings included in MDM below  Diagnostic and Interventional Summary    EKG Interpretation  Date/Time:    Ventricular Rate:    PR Interval:    QRS Duration:   QT Interval:    QTC Calculation:   R Axis:     Text Interpretation:         Labs Reviewed - No data to display  DG Ankle Complete Left  Final Result      Medications  oxyCODONE-acetaminophen (PERCOCET/ROXICET) 5-325 MG per tablet 2 tablet (2 tablets Oral Given 11/04/21 0020)     Procedures  /  Critical Care Procedures  ED Course and Medical Decision Making  I have reviewed the triage vital signs, the nursing notes, and pertinent available records from the EMR.  Social Determinants Affecting Complexity of  Care: Patient has no clinically significant social determinants affecting this chief complaint..   ED Course:    Medical Decision Making Patient here with left ankle pain.  Concern for sprain versus fracture.  Will check imaging.  No fracture seen on x-ray.  Will give cam walker and crutches.  Recommend RICE therapy and outpatient follow-up with orthopedics.  Amount and/or Complexity of Data Reviewed Radiology: ordered and independent interpretation performed.    Details: No obvious fx  Risk Prescription drug management.     Consultants: No consultations were needed in caring for this patient.   Treatment and Plan: Emergency department workup does not suggest an emergent condition requiring admission or immediate intervention beyond  what has been performed at this time. The patient is safe for discharge and has  been instructed to return immediately for worsening symptoms, change in  symptoms or any other concerns    Final Clinical Impressions(s) / ED Diagnoses     ICD-10-CM   1. Sprain of anterior talofibular ligament of left ankle, initial encounter  [phone]       ED Discharge Orders     None         Discharge Instructions Discussed with and Provided to Patient:   Discharge Instructions   None      Montine Circle, PA-C 11/04/21 0112    Palumbo, April, MD  11/04/21 0222  

## 2021-11-20 ENCOUNTER — Other Ambulatory Visit: Payer: Self-pay | Admitting: Obstetrics and Gynecology

## 2021-11-20 DIAGNOSIS — Z1231 Encounter for screening mammogram for malignant neoplasm of breast: Secondary | ICD-10-CM

## 2021-12-07 ENCOUNTER — Ambulatory Visit
Admission: EM | Admit: 2021-12-07 | Discharge: 2021-12-07 | Disposition: A | Discharge status: Home / Self Care | Payer: BC Managed Care – PPO | Attending: Physician Assistant | Admitting: Physician Assistant

## 2021-12-07 ENCOUNTER — Encounter: Payer: Self-pay | Admitting: Emergency Medicine

## 2021-12-07 ENCOUNTER — Other Ambulatory Visit: Payer: Self-pay

## 2021-12-07 DIAGNOSIS — J069 Acute upper respiratory infection, unspecified: Secondary | ICD-10-CM | POA: Diagnosis present

## 2021-12-07 DIAGNOSIS — R0981 Nasal congestion: Secondary | ICD-10-CM | POA: Insufficient documentation

## 2021-12-07 DIAGNOSIS — J029 Acute pharyngitis, unspecified: Secondary | ICD-10-CM | POA: Insufficient documentation

## 2021-12-07 LAB — POCT RAPID STREP A (OFFICE): Rapid Strep A Screen: NEGATIVE

## 2021-12-07 MED ORDER — PREDNISONE 10 MG PO TABS: 10.0000 mg | ORAL_TABLET | Freq: Three times a day (TID) | ORAL | 0 refills | Status: DC

## 2021-12-07 MED ORDER — FLUTICASONE PROPIONATE 50 MCG/ACT NA SUSP: 2.0000 | Freq: Every day | NASAL | 2 refills | Status: DC

## 2021-12-07 NOTE — ED Triage Notes (Signed)
Pt here for sore throat and nasal congestion x 3 days

## 2021-12-07 NOTE — Discharge Instructions (Addendum)
Advised to continue to use DayQuil or NyQuil to help control the congestion. Advised to use the Flonase nasal spray, 2 sprays each nostril once daily to help control sinus congestion and drainage. Advised take the prednisone 10 mg, 1 3 times a day to help reduce the sinus inflammatory reaction. Advised to take Tylenol every 8 hours as needed for pain and discomfort. Follow-up PCP or return to urgent care if symptoms fail to improve.

## 2021-12-07 NOTE — ED Provider Notes (Signed)
[name]    [name]: [id number] Arrival date & time: 12/07/21  1212      History   Chief Complaint Chief Complaint  Patient presents with   Sore Throat    HPI Stacey Rios is a 46 y.o. female.   46 year old female presents with sinus congestion and sore throat.  Patient indicates for the past 3 days she has been having progressive sore throat and painful swallowing.  She indicates she is also having upper respiratory congestion with sinus congestion frontal and maxillary pressure, postnasal drip and rhinitis that is mainly clear production.  Patient indicates she is not having fever, chills, body aches or pain.  Patient indicates that she has had minimal cough and chest congestion.  She does indicates she is taken some OTC cough preparations which tends to help her symptoms a little bit.  She relates she has not been around any family, friends, or coworkers that have been sick.  She is tolerating fluids well.   Sore Throat    Past Medical History:  Diagnosis Date   Complication of anesthesia    pt request not to be given pre-anesthesia medication because she will not wake up for very long time   Degenerative disc disease, lumbar    Family history of adverse reaction to anesthesia    father--- combative   History of ovarian cyst    Menorrhagia    Migraines    OA (osteoarthritis)    PONV (postoperative nausea and vomiting)    Wears contact lenses     There are no problems to display for this patient.   Past Surgical History:  Procedure Laterality Date   ANTERIOR CERVICAL DECOMP/DISCECTOMY FUSION  2019   C5--6   BREAST ENHANCEMENT SURGERY Bilateral 2005   DILITATION & CURRETTAGE/HYSTROSCOPY WITH NOVASURE ABLATION N/A 03/02/2021   Procedure: Attempted HYSTEROSCOPY WITH NOVASURE ABLATION;  Surgeon: Philip Aspen, DO;  Location: [facility];  Service: Gynecology;  Laterality: N/A;   KNEE ARTHROSCOPY Bilateral 2012   LAPAROSCOPIC BILATERAL  SALPINGECTOMY Bilateral 03/02/2021   Procedure: LAPAROSCOPIC BILATERAL SALPINGECTOMY;  Surgeon: Philip Aspen, DO;  Location: [facility];  Service: Gynecology;  Laterality: Bilateral;  with possible myosure in laparoscopic bilateral salpingectomy   TEMPOROMANDIBULAR JOINT SURGERY Bilateral 1995   WRIST SURGERY Bilateral    2012  left wrist tendon repair;/   2015  right wrist tendon repair, ganglion cyst excision, graft    OB History   No obstetric history on file.      Home Medications    Prior to Admission medications   Medication Sig Start Date End Date Taking? Authorizing Provider  fluticasone (FLONASE) 50 MCG/ACT nasal spray Place 2 sprays into both nostrils daily. 12/07/21  Yes Ellsworth Lennox, PA-C  predniSONE (DELTASONE) 10 MG tablet Take 1 tablet (10 mg total) by mouth 3 (three) times daily. 12/07/21  Yes Ellsworth Lennox, PA-C  Ascorbic Acid (VITA-C PO) Take by mouth daily at 12 noon.    [provider]  ASHWAGANDHA PO Take by mouth daily.    [provider]  b complex vitamins capsule Take 1 capsule by mouth daily.    [provider]  BIOTIN PO Take by mouth daily.    [provider]  Cholecalciferol (VITAMIN D3 PO) Take by mouth daily.    [provider]  doxycycline (VIBRAMYCIN) 100 MG capsule Take 1 capsule (100 mg total) by mouth 2 (two) times daily. 10/07/21   Raspet, Noberto Retort, PA-C  Ginger,  Zingiber officinalis, (GINGER ROOT PO) Take by mouth daily.    [provider]  imipramine (TOFRANIL) 25 MG tablet Take 50 mg by mouth daily.    [provider]  MAGNESIUM PO Take by mouth daily.    [provider]  MILK THISTLE PO Take by mouth daily at 12 noon.    [provider]  Misc Natural Products (MAGIC MUSHROOM MIX) CAPS Take by mouth daily. Mushroom complex    [provider]  Multiple Vitamins-Minerals (ZINC PO) Take by mouth daily.    [provider]  Omega-3 Fatty  Acids (FISH OIL PO) Take by mouth daily.    [provider]  ondansetron (ZOFRAN-ODT) 4 MG disintegrating tablet Take 1 tablet (4 mg total) by mouth every 8 (eight) hours as needed for nausea or vomiting. 10/07/21   Raspet, Denny Peon K, PA-C  oxyCODONE (OXY IR/ROXICODONE) 5 MG immediate release tablet Take 1 tablet (5 mg total) by mouth every 6 (six) hours as needed for severe pain or moderate pain. 03/02/21   Philip Aspen, DO  PINE BARK, PYCNOGENOL, PO Take by mouth daily.    [provider]  Probiotic Product (PROBIOTIC PO) Take by mouth daily.    [provider]  TURMERIC PO Take by mouth daily at 12 noon. Patient not taking: Reported on 02/24/2021    [provider]  VITAMIN E PO Take by mouth daily.    [provider]    Family History History reviewed. No pertinent family history.  Social History Social History   Tobacco Use   Smoking status: Never   Smokeless tobacco: Never  Vaping Use   Vaping Use: Every day   Substances: THC, CBD   Devices: unsure  Substance Use Topics   Alcohol use: Yes   Drug use: Not Currently    Types: Marijuana     Allergies   Ibuprofen   Review of Systems Review of Systems  HENT:  Positive for postnasal drip, rhinorrhea, sinus pressure, sinus pain and sore throat.      Physical Exam Triage Vital Signs ED Triage Vitals  Enc Vitals Group     BP 12/07/21 1259 (!) 144/85     Pulse Rate 12/07/21 1259 (!) 102     Resp 12/07/21 1259 18     Temp 12/07/21 1259 98.7 F (37.1 C)     Temp Source 12/07/21 1259 Oral     SpO2 12/07/21 1259 98 %     Weight --      Height --      Head Circumference --      Peak Flow --      Pain Score 12/07/21 1300 5     Pain Loc --      Pain Edu? --      Excl. in GC? --    No data found.  Updated Vital Signs BP (!) 144/85 (BP Location: Left Arm)   Pulse (!) 102   Temp 98.7 F (37.1 C) (Oral)   Resp 18   SpO2 98%   Visual Acuity Right Eye Distance:   Left  Eye Distance:   Bilateral Distance:    Right Eye Near:   Left Eye Near:    Bilateral Near:     Physical Exam Constitutional:      Appearance: She is well-developed.  HENT:     Right Ear: Ear canal normal. Tympanic membrane is injected.     Left Ear: Ear canal normal. Tympanic membrane is injected.  Mouth/Throat:     Mouth: Mucous membranes are moist.     Pharynx: Posterior oropharyngeal erythema present. No pharyngeal swelling or oropharyngeal exudate.  Cardiovascular:     Rate and Rhythm: Normal rate and regular rhythm.     Heart sounds: Normal heart sounds.  Pulmonary:     Effort: Pulmonary effort is normal.     Breath sounds: Normal breath sounds and air entry. No wheezing, rhonchi or rales.  Lymphadenopathy:     Cervical: No cervical adenopathy.  Neurological:     Mental Status: She is alert.      UC Treatments / Results  Labs (all labs ordered are listed, but only abnormal results are displayed) Labs Reviewed  CULTURE, GROUP A STREP [facility])  POCT RAPID STREP A (OFFICE)    EKG   Radiology No results found.  Procedures Procedures (including critical care time)  Medications Ordered in UC Medications - No data to display  Initial Impression / Assessment and Plan / UC Course  I have reviewed the triage vital signs and the nursing notes.  Pertinent labs & imaging results that were available during my care of the patient were reviewed by me and considered in my medical decision making (see chart for details).    Plan: 1.  The acute pharyngitis will be treated with the following: A.  Advised take Tylenol every 8 hours to help treat the sore throat. B.  Advised salt water gargles and lozenges 3-4 times throughout the day to help soothe sore throat. C.  Ultram pending. 2.  The acute upper respiratory infection will be treated with the following: A.  Continue to use DayQuil and NyQuil to help control the upper respiratory congestion 3-4 times a day. 3.  The  sinus congestion will be treated with the following: A.  Prednisone 10 mg 3 times a day for 5 days only to help reduce the acute inflammatory process. B.  Flonase nasal spray, 2 sprays each nostril once daily to help treat the congestion and pressure. 4.  Advised follow-up PCP or return to urgent care if symptoms fail to improve. Final Clinical Impressions(s) / UC Diagnoses   Final diagnoses:  Acute pharyngitis, unspecified etiology  Acute upper respiratory infection  Sinus congestion     Discharge Instructions      Advised to continue to use DayQuil or NyQuil to help control the congestion. Advised to use the Flonase nasal spray, 2 sprays each nostril once daily to help control sinus congestion and drainage. Advised take the prednisone 10 mg, 1 3 times a day to help reduce the sinus inflammatory reaction. Advised to take Tylenol every 8 hours as needed for pain and discomfort. Follow-up PCP or return to urgent care if symptoms fail to improve.    ED Prescriptions     Medication Sig Dispense Auth. Provider   fluticasone (FLONASE) 50 MCG/ACT nasal spray Place 2 sprays into both nostrils daily. 11.1 mL Nyoka Lint, PA-C   predniSONE (DELTASONE) 10 MG tablet Take 1 tablet (10 mg total) by mouth 3 (three) times daily. 15 tablet Nyoka Lint, PA-C      PDMP not reviewed this encounter.   Nyoka Lint, PA-C 12/07/21 1319

## 2021-12-10 LAB — CULTURE, GROUP A STREP (THRC)

## 2021-12-19 ENCOUNTER — Ambulatory Visit
Admission: RE | Admit: 2021-12-19 | Discharge: 2021-12-19 | Disposition: A | Discharge status: Home / Self Care | Payer: BC Managed Care – PPO | Source: Ambulatory Visit | Attending: Obstetrics and Gynecology | Admitting: Obstetrics and Gynecology

## 2021-12-19 DIAGNOSIS — Z1231 Encounter for screening mammogram for malignant neoplasm of breast: Secondary | ICD-10-CM

## 2021-12-21 ENCOUNTER — Other Ambulatory Visit: Payer: Self-pay | Admitting: Obstetrics and Gynecology

## 2021-12-21 DIAGNOSIS — N6310 Unspecified lump in the right breast, unspecified quadrant: Secondary | ICD-10-CM

## 2021-12-21 DIAGNOSIS — N6489 Other specified disorders of breast: Secondary | ICD-10-CM

## 2021-12-26 DIAGNOSIS — R928 Other abnormal and inconclusive findings on diagnostic imaging of breast: Secondary | ICD-10-CM | POA: Insufficient documentation

## 2022-01-01 DIAGNOSIS — Z803 Family history of malignant neoplasm of breast: Secondary | ICD-10-CM | POA: Insufficient documentation

## 2022-01-01 DIAGNOSIS — R32 Unspecified urinary incontinence: Secondary | ICD-10-CM | POA: Insufficient documentation

## 2022-01-09 ENCOUNTER — Ambulatory Visit
Admission: RE | Admit: 2022-01-09 | Discharge: 2022-01-09 | Disposition: A | Discharge status: Home / Self Care | Payer: BC Managed Care – PPO | Source: Ambulatory Visit | Attending: Obstetrics and Gynecology | Admitting: Obstetrics and Gynecology

## 2022-01-09 ENCOUNTER — Other Ambulatory Visit: Payer: Self-pay | Admitting: Obstetrics and Gynecology

## 2022-01-09 DIAGNOSIS — N6489 Other specified disorders of breast: Secondary | ICD-10-CM

## 2022-01-09 DIAGNOSIS — N6310 Unspecified lump in the right breast, unspecified quadrant: Secondary | ICD-10-CM

## 2022-01-10 ENCOUNTER — Other Ambulatory Visit: Payer: Self-pay | Admitting: Obstetrics and Gynecology

## 2022-01-10 DIAGNOSIS — N6310 Unspecified lump in the right breast, unspecified quadrant: Secondary | ICD-10-CM

## 2022-08-21 ENCOUNTER — Ambulatory Visit
Admission: EM | Admit: 2022-08-21 | Discharge: 2022-08-21 | Disposition: A | Discharge status: Home / Self Care | Payer: BC Managed Care – PPO | Attending: Internal Medicine | Admitting: Internal Medicine

## 2022-08-21 ENCOUNTER — Other Ambulatory Visit: Payer: Self-pay

## 2022-08-21 ENCOUNTER — Encounter: Payer: Self-pay | Admitting: Emergency Medicine

## 2022-08-21 DIAGNOSIS — J3489 Other specified disorders of nose and nasal sinuses: Secondary | ICD-10-CM | POA: Insufficient documentation

## 2022-08-21 DIAGNOSIS — Z20822 Contact with and (suspected) exposure to covid-19: Secondary | ICD-10-CM

## 2022-08-21 DIAGNOSIS — R030 Elevated blood-pressure reading, without diagnosis of hypertension: Secondary | ICD-10-CM | POA: Insufficient documentation

## 2022-08-21 DIAGNOSIS — D225 Melanocytic nevi of trunk: Secondary | ICD-10-CM | POA: Insufficient documentation

## 2022-08-21 DIAGNOSIS — J029 Acute pharyngitis, unspecified: Secondary | ICD-10-CM

## 2022-08-21 DIAGNOSIS — J0100 Acute maxillary sinusitis, unspecified: Secondary | ICD-10-CM | POA: Insufficient documentation

## 2022-08-21 DIAGNOSIS — J309 Allergic rhinitis, unspecified: Secondary | ICD-10-CM | POA: Insufficient documentation

## 2022-08-21 DIAGNOSIS — Z1152 Encounter for screening for COVID-19: Secondary | ICD-10-CM | POA: Insufficient documentation

## 2022-08-21 DIAGNOSIS — E669 Obesity, unspecified: Secondary | ICD-10-CM | POA: Insufficient documentation

## 2022-08-21 DIAGNOSIS — Z6834 Body mass index (BMI) 34.0-34.9, adult: Secondary | ICD-10-CM | POA: Insufficient documentation

## 2022-08-21 DIAGNOSIS — R0981 Nasal congestion: Secondary | ICD-10-CM | POA: Diagnosis not present

## 2022-08-21 DIAGNOSIS — R0982 Postnasal drip: Secondary | ICD-10-CM | POA: Insufficient documentation

## 2022-08-21 DIAGNOSIS — N760 Acute vaginitis: Secondary | ICD-10-CM | POA: Insufficient documentation

## 2022-08-21 DIAGNOSIS — R059 Cough, unspecified: Secondary | ICD-10-CM | POA: Insufficient documentation

## 2022-08-21 LAB — POCT RAPID STREP A (OFFICE): Rapid Strep A Screen: NEGATIVE

## 2022-08-21 LAB — SARS CORONAVIRUS 2 (TAT 6-24 HRS): SARS Coronavirus 2: NEGATIVE

## 2022-08-21 NOTE — ED Provider Notes (Signed)
[name]    [name]: [id number] Arrival date & time: 08/21/22  1022      History   Chief Complaint Chief Complaint  Patient presents with   Stacey Rios    HPI Stacey Rios is a 47 y.o. female.   Patient presents with Stacey Rios that started yesterday.  Reports very mild nasal congestion and postnasal drip.  Denies cough, fever, body aches, chills.  Reports possible exposure to strep Rios at Stacey Rios.  Patient has taken Tylenol with some improvement.   Stacey Rios    Past Medical History:  Diagnosis Date   Complication of anesthesia    pt request not to be given pre-anesthesia medication because she will not wake up for very long time   Degenerative disc disease, lumbar    Family history of adverse reaction to anesthesia    father--- combative   History of ovarian cyst    Menorrhagia    Migraines    OA (osteoarthritis)    PONV (postoperative nausea and vomiting)    Wears contact lenses     Patient Active Problem List   Diagnosis Date Noted   Obesity with body mass index 30 or greater 08/21/2022   Elevated blood pressure reading 08/21/2022   Acute maxillary sinusitis 08/21/2022   Allergic rhinitis 08/21/2022   Bacterial vaginosis 08/21/2022   Cough 08/21/2022   Melanocytic nevi of trunk 08/21/2022   Obesity 08/21/2022   Posterior rhinorrhea 08/21/2022   Vaginitis 08/21/2022   Family history of breast cancer 01/01/2022   Urinary incontinence 01/01/2022   Abnormal mammogram 12/26/2021   Neck pain 04/01/2019   Neck problem 02/10/2018   Cervical radiculopathy 06/12/2017   HNP (herniated nucleus pulposus), cervical 06/12/2017   Lesion of vulva 06/05/2016   Low back pain 11/08/2015   Cyst of left ovary 06/22/2015   Abnormal uterine bleeding 04/26/2015   Flushing 04/26/2015   Cyst of right ovary 03/29/2015   Uterine leiomyoma 03/29/2015   Weight gain 02/12/2014   Stress fracture of foot 12/29/2013   Low grade squamous intraepithelial  lesion (LGSIL) on cervicovaginal cytologic smear 02/12/2010    Past Surgical History:  Procedure Laterality Date   ANTERIOR CERVICAL DECOMP/DISCECTOMY FUSION  2019   C5--6   AUGMENTATION MAMMAPLASTY Bilateral    18 years ago   BREAST ENHANCEMENT SURGERY Bilateral 2005   DILITATION & CURRETTAGE/HYSTROSCOPY WITH NOVASURE ABLATION N/A 03/02/2021   Procedure: Attempted HYSTEROSCOPY WITH NOVASURE ABLATION;  Surgeon: Philip Aspen, DO;  Location: [facility] [place];  Service: Gynecology;  Laterality: N/A;   KNEE ARTHROSCOPY Bilateral 2012   LAPAROSCOPIC BILATERAL SALPINGECTOMY Bilateral 03/02/2021   Procedure: LAPAROSCOPIC BILATERAL SALPINGECTOMY;  Surgeon: Philip Aspen, DO;  Location: [facility] [place];  Service: Gynecology;  Laterality: Bilateral;  with possible myosure in laparoscopic bilateral salpingectomy   TEMPOROMANDIBULAR JOINT SURGERY Bilateral 1995   WRIST SURGERY Bilateral    2012  left wrist tendon repair;/   2015  right wrist tendon repair, ganglion cyst excision, graft    OB History   No obstetric history on file.      Home Medications    Prior to Admission medications   Medication Sig Start Date End Date Taking? Authorizing Provider  Imipramine HCl (TOFRANIL PO) imipramine HCl 02/13/11  Yes [provider]  IMIPRAMINE HCL PO imipramine HCl 02/13/11  Yes [provider]  phentermine (ADIPEX-P) 37.5 MG tablet Take 37.5 mg by mouth daily before breakfast. 01/10/22  Yes [provider]  albuterol (PROAIR HFA)  108 (90 Base) MCG/ACT inhaler Inhale 2 puffs into the lungs every 4 (four) hours as needed for wheezing or shortness of breath.    [provider]  Albuterol Sulfate (PROAIR RESPICLICK) 108 (90 Base) MCG/ACT AEPB Inhale 2 puffs into the lungs every 4 (four) hours as needed.    [provider]  ammonium lactate (LAC-HYDRIN) 12 % lotion Apply 1 Application topically as needed.    [provider]   amoxicillin (AMOXIL) 875 MG tablet Take 875 mg by mouth 2 (two) times daily. Patient not taking: Reported on 08/21/2022    [provider]  amoxicillin-clavulanate (AUGMENTIN) 875-125 MG tablet Take 1 tablet by mouth 2 (two) times daily. Patient not taking: Reported on 08/21/2022    [provider]  Ascorbic Acid (VITA-C PO) Take by mouth daily at 12 noon.    [provider]  ASHWAGANDHA PO Take by mouth daily.    [provider]  azithromycin (ZITHROMAX) 250 MG tablet Take 250 mg by mouth daily. Patient not taking: Reported on 08/21/2022    [provider]  b complex vitamins capsule Take 1 capsule by mouth daily.    [provider]  BIOTIN PO Take by mouth daily.    [provider]  BROMPHENIRAMINE-PSEUDOEPH PO brompheniramine-pseudoephedrine-DM 2 mg-30 mg-10 mg/5 mL oral syrup    [provider]  chlorpheniramine-HYDROcodone (TUSSIONEX) 10-8 MG/5ML Take 5 mLs by mouth every 12 (twelve) hours as needed for cough.    [provider]  Cholecalciferol (VITAMIN D3 PO) Take by mouth daily.    [provider]  clarithromycin (BIAXIN XL) 500 MG 24 hr tablet Take 500 mg by mouth daily. Patient not taking: Reported on 08/21/2022    [provider]  clindamycin (CLEOCIN) 300 MG capsule Take 300 mg by mouth as directed. Patient not taking: Reported on 08/21/2022    [provider]  cyclobenzaprine (FLEXERIL) 10 MG tablet Take 10 mg by mouth as directed.    [provider]  cyclobenzaprine (FLEXERIL) 5 MG tablet Take 5 mg by mouth as directed.    [provider]  diazepam (VALIUM) 10 MG tablet Take 10 mg by mouth as directed.    [provider]  doxycycline (VIBRA-TABS) 100 MG tablet Take 100 mg by mouth daily. Patient not taking: Reported on 08/21/2022    [provider]  doxycycline (VIBRAMYCIN) 100 MG capsule Take 1 capsule (100 mg total) by mouth 2 (two) times  daily. Patient not taking: Reported on 08/21/2022 10/07/21   Raspet, Noberto Retort, PA-C  fluconazole (DIFLUCAN) 150 MG tablet Take 150 mg by mouth once. Patient not taking: Reported on 08/21/2022    [provider]  fluticasone (FLONASE) 50 MCG/ACT nasal spray Place 2 sprays into both nostrils daily. 12/07/21   Ellsworth Lennox, PA-C  fluticasone-salmeterol (ADVAIR DISKUS) 250-50 MCG/ACT AEPB Inhale 1 puff into the lungs in the morning and at bedtime.    [provider]  fluticasone-salmeterol (ADVAIR DISKUS) 250-50 MCG/ACT AEPB Inhale 1 puff into the lungs in the morning and at bedtime.    [provider]  fluticasone-salmeterol (ADVAIR) 250-50 MCG/ACT AEPB Inhale 1 puff into the lungs in the morning and at bedtime.    [provider]  Ginger, Zingiber officinalis, (GINGER ROOT PO) Take by mouth daily.    [provider]  HYDROcodone-acetaminophen (NORCO/VICODIN) 5-325 MG tablet Take 1 tablet by mouth every 6 (six) hours as needed for moderate pain or severe pain.    [provider]  HYDROcodone-homatropine (HYDROMET) 5-1.5 MG/5ML syrup Take 5 mLs by mouth every 4 (four) hours as needed for cough. Patient not taking: Reported on 08/21/2022    [provider]  imipramine (TOFRANIL) 25 MG tablet Take 50 mg by mouth daily.    [provider]  imipramine (TOFRANIL) 25 MG tablet Take 25 mg by mouth as directed.    [provider]  imipramine (TOFRANIL) 50 MG tablet Take 2 tablets by mouth daily.    [provider]  IMIPRAMINE HCL PO Imipramine HCl    [provider]  levocetirizine (XYZAL) 5 MG tablet Take 5 mg by mouth every evening.    [provider]  MAGNESIUM PO Take by mouth daily.    [provider]  methocarbamol (ROBAXIN) 500 MG tablet Take 500 mg by mouth as directed.    [provider]  methylPREDNISolone (MEDROL DOSEPAK) 4 MG TBPK tablet Take 4 mg by mouth as directed.    [provider]  metroNIDAZOLE (FLAGYL) 500 MG tablet Take 500 mg by mouth as directed.    [provider]  MILK THISTLE PO Take by mouth daily at 12 noon.    [provider]  Misc Natural Products (MAGIC MUSHROOM MIX) CAPS Take by mouth daily. Mushroom complex    [provider]  mometasone (NASONEX) 50 MCG/ACT nasal spray Place 2 sprays into the nose daily.    [provider]  Multiple Vitamins-Minerals (ZINC PO) Take by mouth daily.    [provider]  norethindrone (ORTHO MICRONOR) 0.35 MG tablet Take 1 tablet by mouth daily.    [provider]  NORETHINDRONE PO Take 1 tablet by mouth daily at 6 (six) AM.    [provider]  norethindrone-ethinyl estradiol-iron (TRI-LEGEST FE) 1-20/1-30/1-35 MG-MCG tablet Take 1 tablet by mouth daily.    [provider]  Norgestimate-Ethinyl Estradiol Triphasic 0.18/0.215/0.25 MG-35 MCG tablet Take 1 tablet by mouth daily.    [provider]  Omega-3 Fatty Acids (FISH OIL PO) Take by mouth daily.    [provider]  ondansetron (ZOFRAN-ODT) 4 MG disintegrating tablet Take 1 tablet (4 mg total) by mouth every 8 (eight) hours as needed for nausea or vomiting. 10/07/21   Raspet, Denny Peon K, PA-C  ondansetron (ZOFRAN-ODT) 8 MG disintegrating tablet Take 8 mg by mouth every 8 (eight) hours as needed for nausea or vomiting.    [provider]  oseltamivir (TAMIFLU) 75 MG capsule Take 75 mg by mouth daily.    [provider]  oxyCODONE (OXY IR/ROXICODONE) 5 MG immediate release tablet Take 1 tablet (5 mg total) by mouth every 6 (six) hours as needed for severe pain or moderate pain. 03/02/21   Philip Aspen, DO  [place], PYCNOGENOL, PO Take by mouth daily.    [provider]  predniSONE (DELTASONE) 10 MG tablet Take 1 tablet (10 mg total) by mouth 3 (three) times daily. Patient not taking: Reported on 08/21/2022 12/07/21   Ellsworth Lennox, PA-C  predniSONE  (DELTASONE) 20 MG tablet Take 20 mg by mouth as directed.    [provider]  Probiotic Product (PROBIOTIC PO) Take by mouth daily.    [provider]  promethazine-dextromethorphan (PROMETHAZINE-DM) 6.25-15 MG/5ML syrup Take 5 mLs by mouth 4 (four) times daily as needed for cough.    [provider]  pseudoephedrine (SUDAFED) 30 MG tablet Take 30 mg by mouth every 6 (six) hours as needed for congestion.    [provider]  sulfamethoxazole-trimethoprim (BACTRIM DS) 800-160 MG tablet Take 1 tablet by mouth daily.    [provider]  SUMAtriptan (IMITREX) 100 MG tablet Take 100 mg by mouth once.    [provider]  traMADol (ULTRAM) 50 MG tablet Take 50 mg by mouth as directed.    [provider]  tranexamic acid (LYSTEDA) 650 MG TABS tablet Take 1,300 mg by mouth as directed.    [provider]  TURMERIC PO Take by mouth daily at 12 noon. Patient not taking: Reported on 02/24/2021    [provider]  UNABLE TO FIND BCP    [provider]  VITAMIN E PO Take by mouth daily.    [provider]    Family History Family History  Problem Relation Age of Onset   Breast cancer Sister    Breast cancer Paternal Aunt     Social History Social History   Tobacco Use   Smoking status: Never   Smokeless tobacco: Never  Vaping Use   Vaping Use: Every day   Substances: THC, CBD   Devices: unsure  Substance Use Topics   Alcohol use: Yes   Drug use: Not Currently    Types: Marijuana     Allergies   Ibuprofen   Review of Systems Review of Systems Per HPI  Physical Exam Triage Vital Signs ED Triage Vitals  Enc Vitals Group     BP 08/21/22 1049 125/77     Pulse Rate 08/21/22 1049 (!) 102     Resp 08/21/22 1049 18     Temp 08/21/22 1049 98.6 F (37 C)     Temp Source 08/21/22 1049 Oral     SpO2 08/21/22 1049 98 %     Weight --      Height --      Head Circumference --      Peak Flow  --      Pain Score 08/21/22 1050 5     Pain Loc --      Pain Edu? --      Excl. in GC? --    No data found.  Updated Vital Signs BP 125/77 (BP Location: Left Arm)   Pulse (!) 102   Temp 98.6 F (37 C) (Oral)   Resp 18   SpO2 98%   Visual Acuity Right Eye Distance:   Left Eye Distance:   Bilateral Distance:    Right Eye Near:   Left Eye Near:    Bilateral Near:     Physical Exam Constitutional:      General: She is not in acute distress.    Appearance: Normal appearance. She is not toxic-appearing or diaphoretic.  HENT:     Head: Normocephalic and atraumatic.     Right Ear: Tympanic membrane and ear canal normal.     Left Ear: Tympanic membrane and ear canal normal.     Nose: Congestion present.     Mouth/Rios:     Mouth: Mucous membranes are moist.     Pharynx: Posterior oropharyngeal erythema present.  Eyes:     Extraocular Movements: Extraocular movements intact.     Conjunctiva/sclera: Conjunctivae normal.     Pupils: Pupils are equal, round, and reactive to light.  Cardiovascular:     Rate and Rhythm: Normal rate and regular rhythm.     Pulses: Normal pulses.     Heart sounds: Normal heart sounds.  Pulmonary:     Effort: Pulmonary effort is normal. No respiratory distress.  Breath sounds: Normal breath sounds. No stridor. No wheezing, rhonchi or rales.  Abdominal:     General: Abdomen is flat. Bowel sounds are normal.     Palpations: Abdomen is soft.  Musculoskeletal:        General: Normal range of motion.     Cervical back: Normal range of motion.  Skin:    General: Skin is warm and dry.  Neurological:     General: No focal deficit present.     Mental Status: She is alert and oriented to person, place, and time. Mental status is at baseline.  Psychiatric:        Mood and Affect: Mood normal.        Behavior: Behavior normal.      UC Treatments / Results  Labs (all labs ordered are listed, but only abnormal results are displayed) Labs  Reviewed  CULTURE, GROUP A STREP ([facility])  SARS CORONAVIRUS 2 (TAT 6-24 HRS)  POCT RAPID STREP A (OFFICE)    EKG   Radiology No results found.  Procedures Procedures (including critical care time)  Medications Ordered in UC Medications - No data to display  Initial Impression / Assessment and Plan / UC Course  I have reviewed the triage vital signs and the nursing notes.  Pertinent labs & imaging results that were available during my care of the patient were reviewed by me and considered in my medical decision making (see chart for details).     Rapid strep was negative so suspect viral etiology.  Rios culture and COVID test pending.  Advised supportive care and symptom management.  Advised follow-up if any symptoms persist or worsen.  Patient verbalized understanding and was agreeable with plan. Final Clinical Impressions(s) / UC Diagnoses   Final diagnoses:  Acute pharyngitis, unspecified etiology  Encounter for laboratory testing for COVID-19 virus     Discharge Instructions      strep test was negative.  Rios culture and COVID test pending.  Will call if it is abnormal.  As we discussed, it appears that you have a viral illness that should run its course.  Follow-up if any symptoms persist or worsen.    ED Prescriptions   None    PDMP not reviewed this encounter.   Gustavus Bryant, [place] 08/21/22 [phone]

## 2022-08-21 NOTE — ED Triage Notes (Signed)
Pt here for sore throat x 2 days 

## 2022-08-21 NOTE — Discharge Instructions (Signed)
strep test was negative.  Throat culture and COVID test pending.  Will call if it is abnormal.  As we discussed, it appears that you have a viral illness that should run its course.  Follow-up if any symptoms persist or worsen.

## 2022-08-22 LAB — CULTURE, GROUP A STREP (THRC)

## 2022-08-23 LAB — CULTURE, GROUP A STREP (THRC)

## 2022-08-30 ENCOUNTER — Emergency Department (HOSPITAL_BASED_OUTPATIENT_CLINIC_OR_DEPARTMENT_OTHER): Payer: BC Managed Care – PPO

## 2022-08-30 ENCOUNTER — Encounter (HOSPITAL_BASED_OUTPATIENT_CLINIC_OR_DEPARTMENT_OTHER): Payer: Self-pay

## 2022-08-30 ENCOUNTER — Emergency Department (HOSPITAL_BASED_OUTPATIENT_CLINIC_OR_DEPARTMENT_OTHER)
Admission: EM | Admit: 2022-08-30 | Discharge: 2022-08-30 | Disposition: A | Discharge status: Home / Self Care | Payer: BC Managed Care – PPO | Attending: Emergency Medicine | Admitting: Emergency Medicine

## 2022-08-30 ENCOUNTER — Other Ambulatory Visit: Payer: Self-pay

## 2022-08-30 DIAGNOSIS — M79604 Pain in right leg: Secondary | ICD-10-CM

## 2022-08-30 DIAGNOSIS — M79671 Pain in right foot: Secondary | ICD-10-CM | POA: Diagnosis not present
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
## 2022-08-30 NOTE — ED Notes (Signed)
Pt states she is not waiting for paperwork, she has left her room EDP made aware
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
## 2023-08-28 ENCOUNTER — Ambulatory Visit: Payer: Self-pay

## 2023-11-13 ENCOUNTER — Ambulatory Visit
Admission: RE | Admit: 2023-11-13 | Discharge: 2023-11-13 | Disposition: A | Discharge status: Home / Self Care | Source: Ambulatory Visit

## 2023-11-13 ENCOUNTER — Other Ambulatory Visit: Payer: Self-pay

## 2023-11-13 VITALS — BP 148/98 | HR 84 | Temp 98.3°F | Resp 16

## 2023-11-13 DIAGNOSIS — B9789 Other viral agents as the cause of diseases classified elsewhere: Secondary | ICD-10-CM | POA: Diagnosis not present

## 2023-11-13 DIAGNOSIS — J019 Acute sinusitis, unspecified: Secondary | ICD-10-CM | POA: Diagnosis not present

## 2023-11-13 MED ORDER — CETIRIZINE-PSEUDOEPHEDRINE ER 5-120 MG PO TB12: 1.0000 | ORAL_TABLET | Freq: Every day | ORAL | 0 refills | Status: AC

## 2023-11-13 MED ORDER — PREDNISONE 20 MG PO TABS: 40.0000 mg | ORAL_TABLET | Freq: Every day | ORAL | 0 refills | Status: AC

## 2023-11-13 MED ORDER — FLUTICASONE PROPIONATE 50 MCG/ACT NA SUSP: 2.0000 | Freq: Every day | NASAL | 0 refills | Status: AC

## 2023-11-13 MED ORDER — PSEUDOEPH-BROMPHEN-DM 30-2-10 MG/5ML PO SYRP: 10.0000 mL | ORAL_SOLUTION | Freq: Four times a day (QID) | ORAL | 0 refills | Status: DC | PRN

## 2023-11-13 NOTE — ED Provider Notes (Signed)
 [name]    [name]: [id number] Arrival date & time: 11/13/23  1448      History   Chief Complaint Chief Complaint  Patient presents with   Nasal Congestion    HPI Stacey Rios is a 48 y.o. female.   Discussed the use of AI scribe software for clinical note transcription with the patient, who gave verbal consent to proceed.   Patient presents with sinus pain and associated symptoms that began 4 days ago, with pain intensifying yesterday. The primary complaint is sinus pain, particularly affecting the eye, top teeth, and left cheek. The patient reports post-nasal drip and a cough. The illness started with sneezing and itchy eyes and throat, but has since progressed to primarily sinus-related symptoms. The patient experiences nasal congestion, runny nose, and a headache attributed to the sinus infection. The cough is likely due to post-nasal drip. The patient denies fever, chills, body aches, nausea, vomiting, or diarrhea. They have not been around anyone specifically ill.   The following sections of the patient's history were reviewed and updated as appropriate: allergies, current medications, past family history, past medical history, past social history, past surgical history, and problem list.      Past Medical History:  Diagnosis Date   Complication of anesthesia    pt request not to be given pre-anesthesia medication because she will not wake up for very long time   Degenerative disc disease, lumbar    Family history of adverse reaction to anesthesia    father--- combative   History of ovarian cyst    Menorrhagia    Migraines    OA (osteoarthritis)    PONV (postoperative nausea and vomiting)    Wears contact lenses     Patient Active Problem List   Diagnosis Date Noted   Obesity with body mass index 30 or greater 08/21/2022   Finding of above normal blood pressure 08/21/2022   Acute maxillary sinusitis 08/21/2022   Allergic rhinitis 08/21/2022    Bacterial vaginosis 08/21/2022   Cough 08/21/2022   Melanocytic nevus of trunk 08/21/2022   Body mass index (BMI) 34.0-34.9, adult 08/21/2022   Posterior rhinorrhea 08/21/2022   Vaginitis 08/21/2022   Family history of breast cancer 01/01/2022   Urinary incontinence 01/01/2022   Abnormal mammogram 12/26/2021   Neck pain 04/01/2019   Neck problem 02/10/2018   Cervical radiculopathy 06/12/2017   HNP (herniated nucleus pulposus), cervical 06/12/2017   Lesion of vulva 06/05/2016   Low back pain 11/08/2015   Cyst of left ovary 06/22/2015   Abnormal uterine bleeding 04/26/2015   Flushing 04/26/2015   Cyst of right ovary 03/29/2015   Uterine leiomyoma 03/29/2015   Weight gain 02/12/2014   Stress fracture of foot 12/29/2013   Low grade squamous intraepithelial lesion (LGSIL) on cervicovaginal cytologic smear 02/12/2010    Past Surgical History:  Procedure Laterality Date   ANTERIOR CERVICAL DECOMP/DISCECTOMY FUSION  2019   C5--6   AUGMENTATION MAMMAPLASTY Bilateral    18 years ago   BREAST ENHANCEMENT SURGERY Bilateral 2005   DILITATION & CURRETTAGE/HYSTROSCOPY WITH NOVASURE ABLATION N/A 03/02/2021   Procedure: Attempted HYSTEROSCOPY WITH NOVASURE ABLATION;  Surgeon: Lilton Legions, DO;  Location: [facility] [place];  Service: Gynecology;  Laterality: N/A;   KNEE ARTHROSCOPY Bilateral 2012   LAPAROSCOPIC BILATERAL SALPINGECTOMY Bilateral 03/02/2021   Procedure: LAPAROSCOPIC BILATERAL SALPINGECTOMY;  Surgeon: Lilton Legions, DO;  Location: [facility] [place];  Service: Gynecology;  Laterality: Bilateral;  with possible myosure in laparoscopic bilateral salpingectomy  TEMPOROMANDIBULAR JOINT SURGERY Bilateral 1995   WRIST SURGERY Bilateral    2012  left wrist tendon repair;/   2015  right wrist tendon repair, ganglion cyst excision, graft    OB History   No obstetric history on file.      Home Medications    Prior to Admission medications   Medication Sig  Start Date End Date Taking? Authorizing Provider  Ascorbic Acid (VITA-C PO) Take by mouth daily at 12 noon.   Yes [provider]  ASHWAGANDHA PO Take by mouth daily.   Yes [provider]  b complex vitamins capsule Take 1 capsule by mouth daily.   Yes [provider]  BIOTIN PO Take by mouth daily.   Yes [provider]  brompheniramine-pseudoephedrine-DM 30-2-10 MG/5ML syrup Take 10 mLs by mouth every 6 (six) hours as needed (cough and congestion). 11/13/23  Yes Karmello Abercrombie, Lucie, FNP  cetirizine-pseudoephedrine (ZYRTEC-D) 5-120 MG tablet Take 1 tablet by mouth daily with breakfast for 10 days. 11/13/23 11/23/23 Yes Iola Lucie, FNP  Cholecalciferol (VITAMIN D3 PO) Take by mouth daily.   Yes [provider]  fluticasone  (FLONASE ) 50 MCG/ACT nasal spray Place 2 sprays into both nostrils daily. Shake well before use. Gently blow nose before spraying. Do not blow nose immediately after use. You should not taste the medication or feel it going down your throat; if you do, adjust your technique. 11/13/23  Yes Iola Lucie, FNP  Ginger, Zingiber officinalis, (GINGER ROOT PO) Take by mouth daily.   Yes [provider]  imipramine (TOFRANIL) 50 MG tablet Take 2 tablets by mouth daily.   Yes [provider]  IMIPRAMINE HCL PO  02/13/11  Yes [provider]  MAGNESIUM PO Take by mouth daily.   Yes [provider]  MILK THISTLE PO Take by mouth daily at 12 noon.   Yes [provider]  Misc Natural Products (MAGIC MUSHROOM MIX) CAPS Take by mouth daily. Mushroom complex   Yes [provider]  Multiple Vitamins-Minerals (ZINC PO) Take by mouth daily.   Yes [provider]  Omega-3 Fatty Acids (FISH OIL PO) Take by mouth daily.   Yes [provider]  PINE BARK, PYCNOGENOL, PO Take by mouth daily.   Yes [provider]  predniSONE  (DELTASONE ) 20 MG tablet Take 2 tablets (40 mg  total) by mouth daily for 5 days. 11/13/23 11/18/23 Yes Iola Lucie, FNP  Probiotic Product (PROBIOTIC PO) Take by mouth daily.   Yes [provider]  tranexamic acid (LYSTEDA) 650 MG TABS tablet Take 1,300 mg by mouth as directed.   Yes [provider]  TURMERIC PO Take by mouth daily at 12 noon.   Yes [provider]  VITAMIN E PO Take by mouth daily.   Yes [provider]  albuterol (PROAIR HFA) 108 (90 Base) MCG/ACT inhaler Inhale 2 puffs into the lungs every 4 (four) hours as needed for wheezing or shortness of breath. Patient not taking: Reported on 11/13/2023    [provider]  Albuterol Sulfate (PROAIR RESPICLICK) 108 (90 Base) MCG/ACT AEPB Inhale 2 puffs into the lungs every 4 (four) hours as needed. Patient not taking: Reported on 11/13/2023    [provider]  ammonium lactate (LAC-HYDRIN) 12 % lotion Apply 1 Application topically as needed. Patient not taking: Reported on 11/13/2023    [provider]  amoxicillin  (AMOXIL ) 875 MG tablet Take 875 mg by mouth 2 (two) times daily. Patient not taking: Reported on  08/21/2022    [provider]  amoxicillin -clavulanate (AUGMENTIN) 875-125 MG tablet Take 1 tablet by mouth 2 (two) times daily. Patient not taking: Reported on 08/21/2022    [provider]  azithromycin (ZITHROMAX) 250 MG tablet Take 250 mg by mouth daily. Patient not taking: Reported on 08/21/2022    [provider]  clarithromycin (BIAXIN XL) 500 MG 24 hr tablet Take 500 mg by mouth daily. Patient not taking: Reported on 08/21/2022    [provider]  clindamycin (CLEOCIN) 300 MG capsule Take 300 mg by mouth as directed. Patient not taking: Reported on 08/21/2022    [provider]  cyclobenzaprine (FLEXERIL) 10 MG tablet Take 10 mg by mouth as directed. Patient not taking: Reported on 11/13/2023    [provider]  cyclobenzaprine (FLEXERIL) 5 MG tablet Take 5 mg by  mouth as directed. Patient not taking: Reported on 11/13/2023    [provider]  diazepam  (VALIUM ) 10 MG tablet Take 10 mg by mouth as directed. Patient not taking: Reported on 11/13/2023    [provider]  doxycycline  (VIBRA -TABS) 100 MG tablet Take 100 mg by mouth daily. Patient not taking: Reported on 08/21/2022    [provider]  doxycycline  (VIBRAMYCIN ) 100 MG capsule Take 1 capsule (100 mg total) by mouth 2 (two) times daily. Patient not taking: Reported on 08/21/2022 10/07/21   Raspet, Erin K, PA-C  fluconazole (DIFLUCAN) 150 MG tablet Take 150 mg by mouth once. Patient not taking: Reported on 08/21/2022    [provider]  fluticasone -salmeterol (ADVAIR DISKUS) 250-50 MCG/ACT AEPB Inhale 1 puff into the lungs in the morning and at bedtime. Patient not taking: Reported on 11/13/2023    [provider]  fluticasone -salmeterol (ADVAIR DISKUS) 250-50 MCG/ACT AEPB Inhale 1 puff into the lungs in the morning and at bedtime. Patient not taking: Reported on 11/13/2023    [provider]  fluticasone -salmeterol (ADVAIR DISKUS) 250-50 MCG/ACT AEPB     [provider]  fluticasone -salmeterol (ADVAIR) 250-50 MCG/ACT AEPB Inhale 1 puff into the lungs in the morning and at bedtime. Patient not taking: Reported on 11/13/2023    [provider]  HYDROcodone-acetaminophen  (NORCO/VICODIN) 5-325 MG tablet Take 1 tablet by mouth every 6 (six) hours as needed for moderate pain or severe pain. Patient not taking: Reported on 11/13/2023    [provider]  HYDROcodone-homatropine (HYDROMET) 5-1.5 MG/5ML syrup Take 5 mLs by mouth every 4 (four) hours as needed for cough. Patient not taking: Reported on 08/21/2022    [provider]  imipramine (TOFRANIL) 25 MG tablet Take 50 mg by mouth daily.    [provider]  imipramine (TOFRANIL) 25 MG tablet Take 25 mg by mouth as directed.    [provider]  Imipramine HCl  (TOFRANIL PO) imipramine HCl 02/13/11   [provider]  IMIPRAMINE HCL PO imipramine HCl 02/13/11   [provider]  IMIPRAMINE HCL PO Imipramine HCl    [provider]  methocarbamol (ROBAXIN) 500 MG tablet Take 500 mg by mouth as directed. Patient not taking: Reported on 11/13/2023    [provider]  mometasone (NASONEX) 50 MCG/ACT nasal spray Place 2 sprays into the nose daily. Patient not taking: Reported on 11/13/2023    [provider]  norethindrone (ORTHO MICRONOR) 0.35 MG tablet Take 1 tablet by mouth daily. Patient not taking: Reported on 11/13/2023    [provider]  NORETHINDRONE PO Take 1 tablet by mouth daily at 6 (six)  AM. Patient not taking: Reported on 11/13/2023    [provider]  norethindrone-ethinyl estradiol-iron (TRI-LEGEST FE) 1-20/1-30/1-35 MG-MCG tablet Take 1 tablet by mouth daily. Patient not taking: Reported on 11/13/2023    [provider]  norethindrone-ethinyl estradiol-iron (TRI-LEGEST FE) 1-20/1-30/1-35 MG-MCG tablet Take 1 tablet by mouth daily. Patient not taking: Reported on 11/13/2023    [provider]  Norgestimate-Ethinyl Estradiol Triphasic 0.18/0.215/0.25 MG-35 MCG tablet Take 1 tablet by mouth daily. Patient not taking: Reported on 11/13/2023    [provider]  Norgestimate-Ethinyl Estradiol Triphasic 0.18/0.215/0.25 MG-35 MCG tablet Take 1 tablet by mouth daily. Patient not taking: Reported on 11/13/2023    [provider]  ondansetron  (ZOFRAN -ODT) 4 MG disintegrating tablet Take 1 tablet (4 mg total) by mouth every 8 (eight) hours as needed for nausea or vomiting. Patient not taking: Reported on 11/13/2023 10/07/21   Raspet, Erin K, PA-C  ondansetron  (ZOFRAN -ODT) 8 MG disintegrating tablet Take 8 mg by mouth every 8 (eight) hours as needed for nausea or vomiting. Patient not taking: Reported on 11/13/2023    [provider]  oseltamivir (TAMIFLU) 75 MG  capsule Take 75 mg by mouth daily. Patient not taking: Reported on 11/13/2023    [provider]  oxyCODONE  (OXY IR/ROXICODONE ) 5 MG immediate release tablet Take 1 tablet (5 mg total) by mouth every 6 (six) hours as needed for severe pain or moderate pain. Patient not taking: Reported on 11/13/2023 03/02/21   Lilton Legions, DO  phentermine (ADIPEX-P) 37.5 MG tablet Take 37.5 mg by mouth daily before breakfast. Patient not taking: Reported on 11/13/2023 01/10/22   [provider]  sulfamethoxazole-trimethoprim (BACTRIM DS) 800-160 MG tablet Take 1 tablet by mouth daily. Patient not taking: Reported on 11/13/2023    [provider]  SUMAtriptan (IMITREX) 100 MG tablet Take 100 mg by mouth once. Patient not taking: Reported on 11/13/2023    [provider]  traMADol (ULTRAM) 50 MG tablet Take 50 mg by mouth as directed. Patient not taking: Reported on 11/13/2023    [provider]  UNABLE TO FIND BCP Patient not taking: Reported on 11/13/2023    [provider]    Family History Family History  Problem Relation Age of Onset   Breast cancer Sister    Breast cancer Paternal Aunt     Social History Social History   Tobacco Use   Smoking status: Never   Smokeless tobacco: Never  Vaping Use   Vaping status: Former   Substances: THC, CBD   Devices: unsure  Substance Use Topics   Alcohol use: Yes    Comment: occasional   Drug use: Not Currently    Types: Marijuana     Allergies   Ibuprofen   Review of Systems Review of Systems  Constitutional:  Negative for chills and fever.  HENT:  Positive for congestion, postnasal drip, rhinorrhea, sinus pressure, sinus pain and sneezing. Negative for sore throat.   Respiratory:  Positive for cough.   Gastrointestinal:  Negative for diarrhea, nausea and vomiting.  Musculoskeletal:  Negative for myalgias.  Neurological:  Positive for headaches.  All other systems reviewed and are  negative.    Physical Exam Triage Vital Signs ED Triage Vitals  Encounter Vitals Group     BP 11/13/23 1521 (!) 148/98     Girls Systolic BP Percentile --      Girls Diastolic BP Percentile --      Boys Systolic BP Percentile --  Boys Diastolic BP Percentile --      Pulse Rate 11/13/23 1521 84     Resp 11/13/23 1521 16     Temp 11/13/23 1521 98.3 F (36.8 C)     Temp Source 11/13/23 1521 Oral     SpO2 11/13/23 1521 99 %     Weight --      Height --      Head Circumference --      Peak Flow --      Pain Score 11/13/23 1515 8     Pain Loc --      Pain Education --      Exclude from Growth Chart --    No data found.  Updated Vital Signs BP (!) 148/98 (BP Location: Left Arm)   Pulse 84   Temp 98.3 F (36.8 C) (Oral)   Resp 16   LMP 11/03/2023   SpO2 99%   Visual Acuity Right Eye Distance:   Left Eye Distance:   Bilateral Distance:    Right Eye Near:   Left Eye Near:    Bilateral Near:     Physical Exam Vitals reviewed.  Constitutional:      General: She is awake. She is not in acute distress.    Appearance: Normal appearance. She is well-developed. She is not ill-appearing, toxic-appearing or diaphoretic.  HENT:     Head: Normocephalic.     Right Ear: Hearing, tympanic membrane, ear canal and external ear normal. No drainage, swelling or tenderness. No middle ear effusion. Tympanic membrane is not erythematous.     Left Ear: Hearing, tympanic membrane, ear canal and external ear normal. No drainage, swelling or tenderness.  No middle ear effusion. Tympanic membrane is not erythematous.     Nose: Congestion present. No rhinorrhea.     Right Sinus: No maxillary sinus tenderness or frontal sinus tenderness.     Left Sinus: No maxillary sinus tenderness or frontal sinus tenderness.     Mouth/Throat:     Lips: Pink.     Mouth: Mucous membranes are moist.     Pharynx: Oropharynx is clear. Uvula midline. No pharyngeal swelling, oropharyngeal exudate, posterior  oropharyngeal erythema or uvula swelling.     Tonsils: No tonsillar exudate or tonsillar abscesses.  Eyes:     General: Vision grossly intact.     Conjunctiva/sclera: Conjunctivae normal.  Cardiovascular:     Rate and Rhythm: Normal rate and regular rhythm.     Heart sounds: Normal heart sounds.  Pulmonary:     Effort: Pulmonary effort is normal.     Breath sounds: Normal breath sounds and air entry.  Musculoskeletal:        General: Normal range of motion.     Cervical back: Full passive range of motion without pain, normal range of motion and neck supple.  Lymphadenopathy:     Cervical: No cervical adenopathy.  Skin:    General: Skin is warm and dry.  Neurological:     General: No focal deficit present.     Mental Status: She is alert and oriented to person, place, and time.  Psychiatric:        Mood and Affect: Mood normal.        Behavior: Behavior normal. Behavior is cooperative.      UC Treatments / Results  Labs (all labs ordered are listed, but only abnormal results are displayed) Labs Reviewed - No data to display  EKG   Radiology No results found.  Procedures Procedures (including  critical care time)  Medications Ordered in UC Medications - No data to display  Initial Impression / Assessment and Plan / UC Course  I have reviewed the triage vital signs and the nursing notes.  Pertinent labs & imaging results that were available during my care of the patient were reviewed by me and considered in my medical decision making (see chart for details).     Patient presents with symptoms consistent with a viral sinus infection. Given the absence of concerning features and exam findings, bacterial infection is less likely. The patient was advised to take medications as prescribed, use supportive care measures such as saline rinses, humidification, and over-the-counter agents for symptom relief, and maintain adequate hydration and rest. Symptoms should gradually  improve over the coming days. The patient was instructed to monitor symptoms closely and follow up with their primary care provider if symptoms persist beyond 10 days, worsen after initial improvement, or are associated with recurrent fevers. They were also advised to seek emergency care if they develop severe headache, facial swelling, vision changes, difficulty breathing, chest pain, or other acute worsening symptoms.  Today's evaluation has revealed no signs of a dangerous process. Discussed diagnosis with patient and/or guardian. Patient and/or guardian aware of their diagnosis, possible red flag symptoms to watch out for and need for close follow up. Patient and/or guardian understands verbal and written discharge instructions. Patient and/or guardian comfortable with plan and disposition.  Patient and/or guardian has a clear mental status at this time, good insight into illness (after discussion and teaching) and has clear judgment to make decisions regarding their care  Documentation was completed with the aid of voice recognition software. Transcription may contain typographical errors.  Final Clinical Impressions(s) / UC Diagnoses   Final diagnoses:  Acute viral sinusitis     Discharge Instructions      You have sinus infection. A sinus infection, also called sinusitis, is inflammation of your sinuses. Sinusitis can be due to bacteria, viruses or allergies. Your infection is likely due to a virus. Since your illness is caused by a virus, antibiotics won't help because antibiotics only treat infections caused by bacteria. Take medications as prescribed. Make sure you finish all the prescribed medications even if you start to feel better. You may also take Tylenol  and/or ibuprofen as needed for pain and/or fever. Drink enough fluid to keep your urine pale yellow. Staying hydrated will also help to thin your mucus. Use a cool mist humidifier to keep the humidity level in your home above 50%.  Inhale steam for 10-15 minutes, 3-4 times a day. You can do this in the bathroom while a hot shower is running and/or purchase over-the-counter vapor shower tablets which is great to help with nasal congestion. Try to limit your exposure to cool or dry air. Sleep with your head raised to decrease post-nasal drainage. Make sure you get enough sleep each night .Once you have finished your medication and your symptoms have improved, remember to replace your toothbrush to help prevent re-infection. If your symptoms do not improved after completing medications, please follow-up with your healthcare provider.      ED Prescriptions     Medication Sig Dispense Auth. Provider   fluticasone  (FLONASE ) 50 MCG/ACT nasal spray Place 2 sprays into both nostrils daily. Shake well before use. Gently blow nose before spraying. Do not blow nose immediately after use. You should not taste the medication or feel it going down your throat; if you do, adjust your technique. 16  g Iola Lukes, FNP   cetirizine-pseudoephedrine (ZYRTEC-D) 5-120 MG tablet Take 1 tablet by mouth daily with breakfast for 10 days. 10 tablet Iola Lukes, FNP   brompheniramine-pseudoephedrine-DM 30-2-10 MG/5ML syrup Take 10 mLs by mouth every 6 (six) hours as needed (cough and congestion). 120 mL Iola Lukes, FNP   predniSONE  (DELTASONE ) 20 MG tablet Take 2 tablets (40 mg total) by mouth daily for 5 days. 10 tablet Iola Lukes, FNP      PDMP not reviewed this encounter.   Iola Lukes, [place] 11/13/23 1729

## 2023-11-13 NOTE — ED Triage Notes (Signed)
 Pt reports sinus congestion since Saturday (5 days). Today she has sinus pain, pressure, drainage and cough. Voices concern for sinus infection. She has been taking dayquil, nyquil, and leftover cough syrup with relief.

## 2023-11-13 NOTE — Discharge Instructions (Addendum)
 You have sinus infection. A sinus infection, also called sinusitis, is inflammation of your sinuses. Sinusitis can be due to bacteria, viruses or allergies. Your infection is likely due to a virus. Since your illness is caused by a virus, antibiotics won't help because antibiotics only treat infections caused by bacteria. Take medications as prescribed. Make sure you finish all the prescribed medications even if you start to feel better. You may also take Tylenol  and/or ibuprofen as needed for pain and/or fever. Drink enough fluid to keep your urine pale yellow. Staying hydrated will also help to thin your mucus. Use a cool mist humidifier to keep the humidity level in your home above 50%. Inhale steam for 10-15 minutes, 3-4 times a day. You can do this in the bathroom while a hot shower is running and/or purchase over-the-counter vapor shower tablets which is great to help with nasal congestion. Try to limit your exposure to cool or dry air. Sleep with your head raised to decrease post-nasal drainage. Make sure you get enough sleep each night .Once you have finished your medication and your symptoms have improved, remember to replace your toothbrush to help prevent re-infection. If your symptoms do not improved after completing medications, please follow-up with your healthcare provider.

## 2023-11-27 ENCOUNTER — Encounter (HOSPITAL_COMMUNITY): Payer: Self-pay | Admitting: Emergency Medicine

## 2023-11-27 ENCOUNTER — Emergency Department (HOSPITAL_COMMUNITY)
Admission: EM | Admit: 2023-11-27 | Discharge: 2023-11-27 | Disposition: A | Discharge status: Home / Self Care | Attending: Emergency Medicine | Admitting: Emergency Medicine

## 2023-11-27 ENCOUNTER — Other Ambulatory Visit: Payer: Self-pay

## 2023-11-27 DIAGNOSIS — M5442 Lumbago with sciatica, left side: Secondary | ICD-10-CM | POA: Diagnosis not present

## 2023-11-27 DIAGNOSIS — M545 Low back pain, unspecified: Secondary | ICD-10-CM | POA: Diagnosis present

## 2023-11-27 DIAGNOSIS — M5432 Sciatica, left side: Secondary | ICD-10-CM

## 2023-11-27 MED ORDER — TIZANIDINE HCL 4 MG PO TABS: 4.0000 mg | ORAL_TABLET | Freq: Four times a day (QID) | ORAL | 0 refills | Status: AC | PRN

## 2023-11-27 MED ORDER — METHYLPREDNISOLONE 4 MG PO TBPK: ORAL_TABLET | ORAL | 0 refills | Status: DC

## 2023-11-27 MED ORDER — METHYLPREDNISOLONE SODIUM SUCC 125 MG IJ SOLR
60.0000 mg | Freq: Once | INTRAMUSCULAR | Status: AC
  Administered 2023-11-27: 60 mg via INTRAMUSCULAR
  Filled 2023-11-27: qty 2

## 2023-11-27 MED ORDER — KETOROLAC TROMETHAMINE 15 MG/ML IJ SOLN
15.0000 mg | Freq: Once | INTRAMUSCULAR | Status: AC
  Administered 2023-11-27: 15 mg via INTRAMUSCULAR
  Filled 2023-11-27: qty 1

## 2023-11-27 NOTE — ED Triage Notes (Signed)
 Pt with lower back pain on the left radiating down leg. No hx of sciatica but pt does report disc problems.  Pain started at noon yesterday with no known precipitating event.

## 2023-11-27 NOTE — ED Provider Notes (Signed)
 [place] EMERGENCY DEPARTMENT AT [facility] Provider Note   [name]: [id number] Arrival date & time: 11/27/23  1124     Patient presents with: Back Pain   Luvena YARITZEL STANGE is a 48 y.o. female.   48 year old female with prior medical history as detailed below presents for evaluation.  Patient complains of left-sided low back pain with radiation to the left leg.  Patient with history of sciatica.  Her symptoms today consistent with prior episodes of sciatic pain.  She did not take anything for pain today.  She denies difficulty urinating.  She denies bowel change.  She denies weakness in her left lower extremity.  She is able to ambulate without difficulty.  The history is provided by the patient and medical records.       Prior to Admission medications   Medication Sig Start Date End Date Taking? Authorizing Provider  albuterol (PROAIR HFA) 108 (90 Base) MCG/ACT inhaler Inhale 2 puffs into the lungs every 4 (four) hours as needed for wheezing or shortness of breath. Patient not taking: Reported on 11/13/2023    [provider]  Albuterol Sulfate (PROAIR RESPICLICK) 108 (90 Base) MCG/ACT AEPB Inhale 2 puffs into the lungs every 4 (four) hours as needed. Patient not taking: Reported on 11/13/2023    [provider]  ammonium lactate (LAC-HYDRIN) 12 % lotion Apply 1 Application topically as needed. Patient not taking: Reported on 11/13/2023    [provider]  amoxicillin  (AMOXIL ) 875 MG tablet Take 875 mg by mouth 2 (two) times daily. Patient not taking: Reported on 08/21/2022    [provider]  amoxicillin -clavulanate (AUGMENTIN) 875-125 MG tablet Take 1 tablet by mouth 2 (two) times daily. Patient not taking: Reported on 08/21/2022    [provider]  Ascorbic Acid (VITA-C PO) Take by mouth daily at 12 noon.    [provider]  ASHWAGANDHA PO Take by mouth daily.    [provider]  azithromycin (ZITHROMAX) 250 MG  tablet Take 250 mg by mouth daily. Patient not taking: Reported on 08/21/2022    [provider]  b complex vitamins capsule Take 1 capsule by mouth daily.    [provider]  BIOTIN PO Take by mouth daily.    [provider]  brompheniramine-pseudoephedrine-DM 30-2-10 MG/5ML syrup Take 10 mLs by mouth every 6 (six) hours as needed (cough and congestion). 11/13/23   Murrill, Samantha, FNP  Cholecalciferol (VITAMIN D3 PO) Take by mouth daily.    [provider]  clarithromycin (BIAXIN XL) 500 MG 24 hr tablet Take 500 mg by mouth daily. Patient not taking: Reported on 08/21/2022    [provider]  clindamycin (CLEOCIN) 300 MG capsule Take 300 mg by mouth as directed. Patient not taking: Reported on 08/21/2022    [provider]  cyclobenzaprine (FLEXERIL) 10 MG tablet Take 10 mg by mouth as directed. Patient not taking: Reported on 11/13/2023    [provider]  cyclobenzaprine (FLEXERIL) 5 MG tablet Take 5 mg by mouth as directed. Patient not taking: Reported on 11/13/2023    [provider]  diazepam  (VALIUM ) 10 MG tablet Take 10 mg by mouth as directed. Patient not taking: Reported on 11/13/2023    [provider]  doxycycline  (VIBRA -TABS) 100 MG tablet Take 100 mg by mouth daily. Patient not taking: Reported on 08/21/2022    [provider]  doxycycline  (VIBRAMYCIN ) 100 MG capsule Take 1 capsule (100 mg total) by mouth 2 (two) times daily.  Patient not taking: Reported on 08/21/2022 10/07/21   Raspet, Erin K, PA-C  fluconazole (DIFLUCAN) 150 MG tablet Take 150 mg by mouth once. Patient not taking: Reported on 08/21/2022    [provider]  fluticasone  (FLONASE ) 50 MCG/ACT nasal spray Place 2 sprays into both nostrils daily. Shake well before use. Gently blow nose before spraying. Do not blow nose immediately after use. You should not taste the medication or feel it going down your throat; if you do, adjust your  technique. 11/13/23   Murrill, Samantha, FNP  fluticasone -salmeterol (ADVAIR DISKUS) 250-50 MCG/ACT AEPB Inhale 1 puff into the lungs in the morning and at bedtime. Patient not taking: Reported on 11/13/2023    [provider]  fluticasone -salmeterol (ADVAIR DISKUS) 250-50 MCG/ACT AEPB Inhale 1 puff into the lungs in the morning and at bedtime. Patient not taking: Reported on 11/13/2023    [provider]  fluticasone -salmeterol (ADVAIR DISKUS) 250-50 MCG/ACT AEPB     [provider]  fluticasone -salmeterol (ADVAIR) 250-50 MCG/ACT AEPB Inhale 1 puff into the lungs in the morning and at bedtime. Patient not taking: Reported on 11/13/2023    [provider]  Ginger, Zingiber officinalis, (GINGER ROOT PO) Take by mouth daily.    [provider]  HYDROcodone-acetaminophen  (NORCO/VICODIN) 5-325 MG tablet Take 1 tablet by mouth every 6 (six) hours as needed for moderate pain or severe pain. Patient not taking: Reported on 11/13/2023    [provider]  HYDROcodone-homatropine (HYDROMET) 5-1.5 MG/5ML syrup Take 5 mLs by mouth every 4 (four) hours as needed for cough. Patient not taking: Reported on 08/21/2022    [provider]  imipramine (TOFRANIL) 25 MG tablet Take 50 mg by mouth daily.    [provider]  imipramine (TOFRANIL) 25 MG tablet Take 25 mg by mouth as directed.    [provider]  imipramine (TOFRANIL) 50 MG tablet Take 2 tablets by mouth daily.    [provider]  Imipramine HCl (TOFRANIL PO) imipramine HCl 02/13/11   [provider]  IMIPRAMINE HCL PO imipramine HCl 02/13/11   [provider]  IMIPRAMINE HCL PO Imipramine HCl    [provider]  IMIPRAMINE HCL PO  02/13/11   [provider]  MAGNESIUM PO Take by mouth daily.    [provider]  methocarbamol (ROBAXIN) 500 MG tablet Take 500 mg by mouth as directed. Patient not taking: Reported on 11/13/2023     [provider]  MILK THISTLE PO Take by mouth daily at 12 noon.    [provider]  Misc Natural Products (MAGIC MUSHROOM MIX) CAPS Take by mouth daily. Mushroom complex    [provider]  mometasone (NASONEX) 50 MCG/ACT nasal spray Place 2 sprays into the nose daily. Patient not taking: Reported on 11/13/2023    [provider]  Multiple Vitamins-Minerals (ZINC PO) Take by mouth daily.    [provider]  norethindrone (ORTHO MICRONOR) 0.35 MG tablet Take 1 tablet by mouth daily. Patient not taking: Reported on 11/13/2023    [provider]  NORETHINDRONE PO Take 1 tablet by mouth daily at 6 (six) AM. Patient not taking: Reported on 11/13/2023    [provider]  norethindrone-ethinyl estradiol-iron (TRI-LEGEST FE) 1-20/1-30/1-35 MG-MCG tablet Take 1 tablet by mouth daily. Patient not taking: Reported on 11/13/2023    [provider]  norethindrone-ethinyl estradiol-iron (TRI-LEGEST FE) 1-20/1-30/1-35 MG-MCG tablet Take 1 tablet by mouth daily. Patient not taking: Reported on 11/13/2023  [provider]  Norgestimate-Ethinyl Estradiol Triphasic 0.18/0.215/0.25 MG-35 MCG tablet Take 1 tablet by mouth daily. Patient not taking: Reported on 11/13/2023    [provider]  Norgestimate-Ethinyl Estradiol Triphasic 0.18/0.215/0.25 MG-35 MCG tablet Take 1 tablet by mouth daily. Patient not taking: Reported on 11/13/2023    [provider]  Omega-3 Fatty Acids (FISH OIL PO) Take by mouth daily.    [provider]  ondansetron  (ZOFRAN -ODT) 4 MG disintegrating tablet Take 1 tablet (4 mg total) by mouth every 8 (eight) hours as needed for nausea or vomiting. Patient not taking: Reported on 11/13/2023 10/07/21   Raspet, Erin K, PA-C  ondansetron  (ZOFRAN -ODT) 8 MG disintegrating tablet Take 8 mg by mouth every 8 (eight) hours as needed for nausea or vomiting. Patient not taking: Reported on 11/13/2023     [provider]  oseltamivir (TAMIFLU) 75 MG capsule Take 75 mg by mouth daily. Patient not taking: Reported on 11/13/2023    [provider]  oxyCODONE  (OXY IR/ROXICODONE ) 5 MG immediate release tablet Take 1 tablet (5 mg total) by mouth every 6 (six) hours as needed for severe pain or moderate pain. Patient not taking: Reported on 11/13/2023 03/02/21   Lilton Legions, DO  phentermine (ADIPEX-P) 37.5 MG tablet Take 37.5 mg by mouth daily before breakfast. Patient not taking: Reported on 11/13/2023 01/10/22   [provider]  PINE BARK, PYCNOGENOL, PO Take by mouth daily.    [provider]  Probiotic Product (PROBIOTIC PO) Take by mouth daily.    [provider]  sulfamethoxazole-trimethoprim (BACTRIM DS) 800-160 MG tablet Take 1 tablet by mouth daily. Patient not taking: Reported on 11/13/2023    [provider]  SUMAtriptan (IMITREX) 100 MG tablet Take 100 mg by mouth once. Patient not taking: Reported on 11/13/2023    [provider]  traMADol (ULTRAM) 50 MG tablet Take 50 mg by mouth as directed. Patient not taking: Reported on 11/13/2023    [provider]  tranexamic acid (LYSTEDA) 650 MG TABS tablet Take 1,300 mg by mouth as directed.    [provider]  TURMERIC PO Take by mouth daily at 12 noon.    [provider]  UNABLE TO FIND BCP Patient not taking: Reported on 11/13/2023    [provider]  VITAMIN E PO Take by mouth daily.    [provider]    Allergies: Ibuprofen    Review of Systems  All other systems reviewed and are negative.   Updated Vital Signs BP (!) 163/112 (BP Location: Right Arm)   Pulse (!) 110   Temp 97.8 F (36.6 C) (Oral)   Resp 19   LMP 11/03/2023   SpO2 100%   Physical Exam Vitals and nursing note reviewed.  Constitutional:      General: She is not in acute distress.    Appearance: Normal appearance. She is well-developed.  HENT:     Head:  Normocephalic and atraumatic.  Eyes:     Conjunctiva/sclera: Conjunctivae normal.     Pupils: Pupils are equal, round, and reactive to light.  Cardiovascular:     Rate and Rhythm: Normal rate and regular rhythm.     Heart sounds: Normal heart sounds.  Pulmonary:     Effort: Pulmonary effort is normal. No respiratory distress.     Breath sounds: Normal breath sounds.  Abdominal:     General: There is no distension.     Palpations: Abdomen is soft.     Tenderness: There  is no abdominal tenderness.  Musculoskeletal:        General: No deformity. Normal range of motion.     Cervical back: Normal range of motion and neck supple.     Comments: Localizes maximal pain to the left low back.  No midline tenderness appreciated.  Distal bilateral lower extremities with 5 out of 5 strength.  Patient with normal gait.  Skin:    General: Skin is warm and dry.  Neurological:     General: No focal deficit present.     Mental Status: She is alert and oriented to person, place, and time.     (all labs ordered are listed, but only abnormal results are displayed) Labs Reviewed - No data to display  EKG: None  Radiology: No results found.   Procedures   Medications Ordered in the ED - No data to display                                  Medical Decision Making Patient is presenting with left low back pain.  Described pain is entirely consistent with left-sided sciatica.  She has had similar symptoms in the past.  She reports that her pain today is worse than what she has recently experienced.  She is without lower extremity weakness.  She is without bladder or bowel symptoms.  Patient would benefit from course of steroids, anti-inflammatories, close follow-up with neurosurgery and spine.  Patient has a history of prior spine issues and has seen both neurosurgery and orthopedic spine outpatient clinics previously.  She was advised to follow-up with prior providers.  Patient is interested in  obtaining MRI today.  She is advised that the wait time for the MRI study today will be lengthy.  She does not want to wait long enough to get an MRI through the ED today.  She does not have an indication for emergent MRI.  Importance of close follow-up stressed.  Strict return precautions given and understood.  Risk Prescription drug management.        Final diagnoses:  Sciatica of left side    ED Discharge Orders          Ordered    methylPREDNISolone (MEDROL DOSEPAK) 4 MG TBPK tablet        11/27/23 1212    tiZANidine (ZANAFLEX) 4 MG tablet  Every 6 hours PRN        11/27/23 1212               Laurice Maude BROCKS, MD 11/27/23 1214

## 2023-11-27 NOTE — Discharge Instructions (Addendum)
 Return for any problem.   Take Medrol Dosepak as instructed.  Use ibuprofen for anti-inflammatory effect.  Trial Zanaflex as needed for muscle spasm.  Follow-up closely with your already established outpatient spine clinic.  If required you can also see Dr. Louis with [facility].

## 2023-12-03 ENCOUNTER — Other Ambulatory Visit: Payer: Self-pay

## 2023-12-03 ENCOUNTER — Encounter (HOSPITAL_COMMUNITY): Payer: Self-pay

## 2023-12-03 ENCOUNTER — Emergency Department (HOSPITAL_COMMUNITY)

## 2023-12-03 ENCOUNTER — Emergency Department (HOSPITAL_COMMUNITY)
Admission: EM | Admit: 2023-12-03 | Discharge: 2023-12-03 | Disposition: A | Discharge status: Home / Self Care | Attending: Emergency Medicine | Admitting: Emergency Medicine

## 2023-12-03 DIAGNOSIS — M545 Low back pain, unspecified: Secondary | ICD-10-CM | POA: Diagnosis present

## 2023-12-03 DIAGNOSIS — M5441 Lumbago with sciatica, right side: Secondary | ICD-10-CM | POA: Insufficient documentation

## 2023-12-03 LAB — PREGNANCY, URINE: Preg Test, Ur: NEGATIVE

## 2023-12-03 MED ORDER — PREDNISONE 50 MG PO TABS: 50.0000 mg | ORAL_TABLET | Freq: Every day | ORAL | 0 refills | Status: AC

## 2023-12-03 MED ORDER — HYDROCODONE-ACETAMINOPHEN 5-325 MG PO TABS: 2.0000 | ORAL_TABLET | ORAL | 0 refills | Status: AC | PRN

## 2023-12-03 MED ORDER — PREDNISONE 20 MG PO TABS
60.0000 mg | ORAL_TABLET | Freq: Once | ORAL | Status: AC
  Administered 2023-12-03: 60 mg via ORAL
  Filled 2023-12-03: qty 3

## 2023-12-03 NOTE — ED Provider Triage Note (Signed)
 Emergency Medicine Provider Triage Evaluation Note  Stacey Rios , a 48 y.o. female  was evaluated in triage.  Pt complains of L sided sciatic, sharp pain starting 1 week ago. Hx of degenerative disc. Pain radiates down L leg. Took steroids and muscle relaxer with some relief. However, pain returns today after finishing steroids. Requesting MRI.   Reported having sinusitis with heavy coughing fit where she then started to experience the back pain.   Denies numbness, tingling, saddle paresthesias, urinary/fecal incontinence, unexplained weight loss, fever, dysuria, chest pain, shortness of breath, abdominal pain, n/v/d.   Review of Systems  Positive: N/a Negative: N/a  Physical Exam  BP (!) 178/90   Pulse 90   Temp 98.1 F (36.7 C) (Oral)   Resp 19   Ht 5' 1 (1.549 m)   Wt 79.4 kg   LMP 11/03/2023   SpO2 97%   BMI 33.07 kg/m  Gen:   Awake, no distress   Resp:  Normal effort  MSK:   Moves extremities without difficulty  Other:    Medical Decision Making  Medically screening exam initiated at 1:21 PM.  Appropriate orders placed.  Hayzel KAYLIEE ATIENZA was informed that the remainder of the evaluation will be completed by another provider, this initial triage assessment does not replace that evaluation, and the importance of remaining in the ED until their evaluation is complete.     Beola Terrall RAMAN, [place] 12/03/23 1326

## 2023-12-03 NOTE — ED Triage Notes (Addendum)
 Patient was here last week. Said she has a pinched sciatica nerve on the left side. She is requesting MRI. Finished steroids.

## 2023-12-03 NOTE — ED Provider Notes (Signed)
 [place] EMERGENCY DEPARTMENT AT [facility] Provider Note   [name]: [id number] Arrival date & time: 12/03/23  1248     Patient presents with: Wants MRI   Stacey Rios is a 48 y.o. female who presents to the ED today out of concern for pain in the left lower back/buttock that radiates down the left leg has been persistent over the last week.  Initially presented to the ED at onset and was managed with a course of oral steroids, had some improvement however has worsening of her back pain now that she is no longer on prednisone .  Has appointment set with neurosurgery in the next week.  Presents to the ED out of concern that she may need further imaging, no imaging obtained at a prior visit, request MRI to be performed.   HPI     Prior to Admission medications   Medication Sig Start Date End Date Taking? Authorizing Provider  HYDROcodone-acetaminophen  (NORCO/VICODIN) 5-325 MG tablet Take 2 tablets by mouth every 4 (four) hours as needed. 12/03/23  Yes Myriam Dorn BROCKS, PA  predniSONE  (DELTASONE ) 50 MG tablet Take 1 tablet (50 mg total) by mouth daily with breakfast for 5 days. 12/03/23 12/08/23 Yes Myriam Dorn BROCKS, PA  albuterol (PROAIR HFA) 108 (90 Base) MCG/ACT inhaler Inhale 2 puffs into the lungs every 4 (four) hours as needed for wheezing or shortness of breath. Patient not taking: Reported on 11/13/2023    [provider]  Albuterol Sulfate (PROAIR RESPICLICK) 108 (90 Base) MCG/ACT AEPB Inhale 2 puffs into the lungs every 4 (four) hours as needed. Patient not taking: Reported on 11/13/2023    [provider]  ammonium lactate (LAC-HYDRIN) 12 % lotion Apply 1 Application topically as needed. Patient not taking: Reported on 11/13/2023    [provider]  amoxicillin  (AMOXIL ) 875 MG tablet Take 875 mg by mouth 2 (two) times daily. Patient not taking: Reported on 08/21/2022    [provider]  amoxicillin -clavulanate (AUGMENTIN) 875-125  MG tablet Take 1 tablet by mouth 2 (two) times daily. Patient not taking: Reported on 08/21/2022    [provider]  Ascorbic Acid (VITA-C PO) Take by mouth daily at 12 noon.    [provider]  ASHWAGANDHA PO Take by mouth daily.    [provider]  azithromycin (ZITHROMAX) 250 MG tablet Take 250 mg by mouth daily. Patient not taking: Reported on 08/21/2022    [provider]  b complex vitamins capsule Take 1 capsule by mouth daily.    [provider]  BIOTIN PO Take by mouth daily.    [provider]  brompheniramine-pseudoephedrine-DM 30-2-10 MG/5ML syrup Take 10 mLs by mouth every 6 (six) hours as needed (cough and congestion). 11/13/23   Murrill, Samantha, FNP  Cholecalciferol (VITAMIN D3 PO) Take by mouth daily.    [provider]  clarithromycin (BIAXIN XL) 500 MG 24 hr tablet Take 500 mg by mouth daily. Patient not taking: Reported on 08/21/2022    [provider]  clindamycin (CLEOCIN) 300 MG capsule Take 300 mg by mouth as directed. Patient not taking: Reported on 08/21/2022    [provider]  cyclobenzaprine (FLEXERIL) 10 MG tablet Take 10 mg by mouth as directed. Patient not taking: Reported on 11/13/2023    [provider]  cyclobenzaprine (FLEXERIL) 5 MG tablet Take 5 mg by mouth as directed. Patient not taking: Reported on 11/13/2023    [provider]  diazepam  (VALIUM ) 10 MG tablet Take  10 mg by mouth as directed. Patient not taking: Reported on 11/13/2023    [provider]  doxycycline  (VIBRA -TABS) 100 MG tablet Take 100 mg by mouth daily. Patient not taking: Reported on 08/21/2022    [provider]  doxycycline  (VIBRAMYCIN ) 100 MG capsule Take 1 capsule (100 mg total) by mouth 2 (two) times daily. Patient not taking: Reported on 08/21/2022 10/07/21   Raspet, Erin K, PA-C  fluconazole (DIFLUCAN) 150 MG tablet Take 150 mg by mouth once. Patient not taking: Reported on  08/21/2022    [provider]  fluticasone  (FLONASE ) 50 MCG/ACT nasal spray Place 2 sprays into both nostrils daily. Shake well before use. Gently blow nose before spraying. Do not blow nose immediately after use. You should not taste the medication or feel it going down your throat; if you do, adjust your technique. 11/13/23   Murrill, Samantha, FNP  fluticasone -salmeterol (ADVAIR DISKUS) 250-50 MCG/ACT AEPB Inhale 1 puff into the lungs in the morning and at bedtime. Patient not taking: Reported on 11/13/2023    [provider]  fluticasone -salmeterol (ADVAIR DISKUS) 250-50 MCG/ACT AEPB Inhale 1 puff into the lungs in the morning and at bedtime. Patient not taking: Reported on 11/13/2023    [provider]  fluticasone -salmeterol (ADVAIR DISKUS) 250-50 MCG/ACT AEPB     [provider]  fluticasone -salmeterol (ADVAIR) 250-50 MCG/ACT AEPB Inhale 1 puff into the lungs in the morning and at bedtime. Patient not taking: Reported on 11/13/2023    [provider]  Ginger, Zingiber officinalis, (GINGER ROOT PO) Take by mouth daily.    [provider]  HYDROcodone-homatropine (HYDROMET) 5-1.5 MG/5ML syrup Take 5 mLs by mouth every 4 (four) hours as needed for cough. Patient not taking: Reported on 08/21/2022    [provider]  imipramine (TOFRANIL) 25 MG tablet Take 50 mg by mouth daily.    [provider]  imipramine (TOFRANIL) 25 MG tablet Take 25 mg by mouth as directed.    [provider]  imipramine (TOFRANIL) 50 MG tablet Take 2 tablets by mouth daily.    [provider]  Imipramine HCl (TOFRANIL PO) imipramine HCl 02/13/11   [provider]  IMIPRAMINE HCL PO imipramine HCl 02/13/11   [provider]  IMIPRAMINE HCL PO Imipramine HCl    [provider]  IMIPRAMINE HCL PO  02/13/11   [provider]  MAGNESIUM PO Take by mouth daily.    [provider]  methocarbamol (ROBAXIN)  500 MG tablet Take 500 mg by mouth as directed. Patient not taking: Reported on 11/13/2023    [provider]  methylPREDNISolone (MEDROL DOSEPAK) 4 MG TBPK tablet Medrol dose pak -take as per manufacturer's instructions 11/27/23   Laurice Maude BROCKS, MD  MILK THISTLE PO Take by mouth daily at 12 noon.    [provider]  Misc Natural Products (MAGIC MUSHROOM MIX) CAPS Take by mouth daily. Mushroom complex    [provider]  mometasone (NASONEX) 50 MCG/ACT nasal spray Place 2 sprays into the nose daily. Patient not taking: Reported on 11/13/2023    [provider]  Multiple Vitamins-Minerals (ZINC PO) Take by mouth daily.    [provider]  norethindrone (ORTHO MICRONOR) 0.35 MG tablet Take 1 tablet by mouth daily. Patient not taking: Reported on 11/13/2023    [provider]  NORETHINDRONE PO Take 1 tablet by mouth daily at 6 (six) AM. Patient not taking: Reported on 11/13/2023    [provider]  norethindrone-ethinyl estradiol-iron (TRI-LEGEST FE) 1-20/1-30/1-35 MG-MCG tablet Take 1 tablet by mouth daily. Patient not taking: Reported on 11/13/2023    [provider]  norethindrone-ethinyl estradiol-iron (TRI-LEGEST FE) 1-20/1-30/1-35 MG-MCG tablet Take 1 tablet by mouth daily. Patient not taking: Reported on 11/13/2023    [provider]  Norgestimate-Ethinyl Estradiol Triphasic 0.18/0.215/0.25 MG-35 MCG tablet Take 1 tablet by mouth daily. Patient not taking: Reported on 11/13/2023    [provider]  Norgestimate-Ethinyl Estradiol Triphasic 0.18/0.215/0.25 MG-35 MCG tablet Take 1 tablet by mouth daily. Patient not taking: Reported on 11/13/2023    [provider]  Omega-3 Fatty Acids (FISH OIL PO) Take by mouth daily.    [provider]  ondansetron  (ZOFRAN -ODT) 4 MG disintegrating tablet Take 1 tablet (4 mg total) by mouth every 8 (eight) hours as needed for nausea or vomiting. Patient not  taking: Reported on 11/13/2023 10/07/21   Raspet, Erin K, PA-C  ondansetron  (ZOFRAN -ODT) 8 MG disintegrating tablet Take 8 mg by mouth every 8 (eight) hours as needed for nausea or vomiting. Patient not taking: Reported on 11/13/2023    [provider]  oseltamivir (TAMIFLU) 75 MG capsule Take 75 mg by mouth daily. Patient not taking: Reported on 11/13/2023    [provider]  oxyCODONE  (OXY IR/ROXICODONE ) 5 MG immediate release tablet Take 1 tablet (5 mg total) by mouth every 6 (six) hours as needed for severe pain or moderate pain. Patient not taking: Reported on 11/13/2023 03/02/21   Lilton Legions, DO  phentermine (ADIPEX-P) 37.5 MG tablet Take 37.5 mg by mouth daily before breakfast. Patient not taking: Reported on 11/13/2023 01/10/22   [provider]  PINE BARK, PYCNOGENOL, PO Take by mouth daily.    [provider]  Probiotic Product (PROBIOTIC PO) Take by mouth daily.    [provider]  sulfamethoxazole-trimethoprim (BACTRIM DS) 800-160 MG tablet Take 1 tablet by mouth daily. Patient not taking: Reported on 11/13/2023    [provider]  SUMAtriptan (IMITREX) 100 MG tablet Take 100 mg by mouth once. Patient not taking: Reported on 11/13/2023    [provider]  tiZANidine (ZANAFLEX) 4 MG tablet Take 1 tablet (4 mg total) by mouth every 6 (six) hours as needed for muscle spasms. 11/27/23   Laurice Maude BROCKS, MD  traMADol (ULTRAM) 50 MG tablet Take 50 mg by mouth as directed. Patient not taking: Reported on 11/13/2023    [provider]  tranexamic acid (LYSTEDA) 650 MG TABS tablet Take 1,300 mg by mouth as directed.    [provider]  TURMERIC PO Take by mouth daily at 12 noon.    [provider]  UNABLE TO FIND BCP Patient not taking: Reported on 11/13/2023    [provider]  VITAMIN E PO Take by mouth daily.    [provider]    Allergies: Ibuprofen    Review of Systems   Musculoskeletal:  Positive for arthralgias and back pain.  All other systems reviewed and are negative.   Updated Vital Signs BP (!) 178/90   Pulse 90   Temp 98.1 F (36.7 C) (Oral)   Resp 19   Ht 5' 1 (1.549 m)   Wt 79.4 kg   LMP 11/03/2023   SpO2 97%   BMI 33.07 kg/m   Physical Exam Vitals and nursing note reviewed.  Constitutional:      General: She is not in acute distress.    Appearance: Normal appearance.  HENT:     Head:  Normocephalic and atraumatic.     Mouth/Throat:     Mouth: Mucous membranes are moist.     Pharynx: Oropharynx is clear.  Eyes:     Extraocular Movements: Extraocular movements intact.     Conjunctiva/sclera: Conjunctivae normal.     Pupils: Pupils are equal, round, and reactive to light.  Cardiovascular:     Rate and Rhythm: Normal rate and regular rhythm.     Pulses: Normal pulses.     Heart sounds: Normal heart sounds. No murmur heard.    No friction rub. No gallop.  Pulmonary:     Effort: Pulmonary effort is normal.     Breath sounds: Normal breath sounds.  Abdominal:     General: Abdomen is flat. Bowel sounds are normal.     Palpations: Abdomen is soft.  Musculoskeletal:     Cervical back: Normal, normal range of motion and neck supple.     Thoracic back: Normal.     Lumbar back: Tenderness present. Positive right straight leg raise test.     Right lower leg: No edema.     Left lower leg: No edema.     Comments: Tenderness on palpation of the left paraspinal muscles and along the superior iliac crest.  Skin:    General: Skin is warm and dry.     Capillary Refill: Capillary refill takes less than 2 seconds.  Neurological:     General: No focal deficit present.     Mental Status: She is alert. Mental status is at baseline.  Psychiatric:        Mood and Affect: Mood normal.     (all labs ordered are listed, but only abnormal results are displayed) Labs Reviewed  PREGNANCY, URINE    EKG: None  Radiology: CT Lumbar Spine  Wo Contrast Result Date: 12/03/2023 EXAM: CT OF THE LUMBAR SPINE WITHOUT CONTRAST 12/03/2023 06:16:37 PM TECHNIQUE: CT of the lumbar spine was performed without the administration of intravenous contrast. Multiplanar reformatted images are provided for review. Automated exposure control, iterative reconstruction, and/or weight based adjustment of the mA/kV was utilized to reduce the radiation dose to as low as reasonably achievable. COMPARISON: None available. CLINICAL HISTORY: Myelopathy, acute, lumbar spine. L sided sciatic, sharp pain starting 1 week ago. Hx of degenerative disc. Pain radiates down L leg. FINDINGS: BONES AND ALIGNMENT: Normal vertebral body heights. No acute fracture or suspicious bone lesion. Grade 1 anterolisthesis of L4 is likely chronic. DEGENERATIVE CHANGES: No severe neural foraminal or spinal canal narrowing. SOFT TISSUES: No acute abnormality. IMPRESSION: 1. No acute findings. Electronically signed by: Norman Gatlin MD 12/03/2023 06:37 PM EDT RP Workstation: [id number]     Procedures   Medications Ordered in the ED  predniSONE  (DELTASONE ) tablet 60 mg (has no administration in time range)                                    Medical Decision Making Risk Prescription drug management.   Given the patient's complaints, consider possible piriformis syndrome also consider spinal stenosis, degenerative disc disease, spinal nerve impingement.  CT imaging is obtained to evaluate for possible spinal cause of her pain.  In the event plan to manage pain with continue course of corticosteroids, and have patient follow-up with planned appointment with neurosurgery in 1 week.  CT imaging is negative, given this finding along with the findings of sciatica will manage with a course of prednisone  along with  prednisone  that was administered in the ED here today.  Further will have her given a short course of hydrocodone to manage breakthrough pain.  She has referral to neurosurgery  and will follow-up as previously scheduled.  Given that imaging is negative, physical exam is reassuring, and vital signs are stable will discharge patient with outpatient follow-up.     Final diagnoses:  Acute right-sided low back pain with right-sided sciatica    ED Discharge Orders          Ordered    predniSONE  (DELTASONE ) 50 MG tablet  Daily with breakfast        12/03/23 1850    HYDROcodone-acetaminophen  (NORCO/VICODIN) 5-325 MG tablet  Every 4 hours PRN        12/03/23 1850               Myriam Dorn BROCKS, [place] 12/03/23 CLAIR    Randol Simmonds, MD 12/04/23 [phone]

## 2024-01-30 ENCOUNTER — Ambulatory Visit: Admission: EM | Admit: 2024-01-30 | Discharge: 2024-01-30 | Disposition: A | Discharge status: Home / Self Care

## 2024-01-30 ENCOUNTER — Encounter: Payer: Self-pay | Admitting: *Deleted

## 2024-01-30 DIAGNOSIS — J069 Acute upper respiratory infection, unspecified: Secondary | ICD-10-CM | POA: Diagnosis not present

## 2024-01-30 LAB — POC COVID19/FLU A&B COMBO
Covid Antigen, POC: NEGATIVE
Influenza A Antigen, POC: NEGATIVE
Influenza B Antigen, POC: NEGATIVE

## 2024-01-30 NOTE — ED Provider Notes (Signed)
 [name]    [name]: [id number] Arrival date & time: 01/30/24  1652      History   Chief Complaint Chief Complaint  Patient presents with   Cough    HPI Stacey Rios is a 48 y.o. female.   Pt presents today due to cough, nasal congestion, body aches, and post nasal drip that started today/yesterday. Pt states that she has sick contacts at work, states RSV and pneumonia are going around. Pt states that she has been using Dayquil and Nyquil for symptoms with mild relief.   The history is provided by the patient.  Cough   Past Medical History:  Diagnosis Date   Complication of anesthesia    pt request not to be given pre-anesthesia medication because she will not wake up for very long time   Degenerative disc disease, lumbar    Family history of adverse reaction to anesthesia    father--- combative   History of ovarian cyst    Menorrhagia    Migraines    OA (osteoarthritis)    PONV (postoperative nausea and vomiting)    Wears contact lenses     Patient Active Problem List   Diagnosis Date Noted   Obesity with body mass index 30 or greater 08/21/2022   Finding of above normal blood pressure 08/21/2022   Acute maxillary sinusitis 08/21/2022   Allergic rhinitis 08/21/2022   Bacterial vaginosis 08/21/2022   Cough 08/21/2022   Melanocytic nevus of trunk 08/21/2022   Body mass index (BMI) 34.0-34.9, adult 08/21/2022   Posterior rhinorrhea 08/21/2022   Vaginitis 08/21/2022   Family history of breast cancer 01/01/2022   Urinary incontinence 01/01/2022   Abnormal mammogram 12/26/2021   Neck pain 04/01/2019   Neck problem 02/10/2018   Cervical radiculopathy 06/12/2017   HNP (herniated nucleus pulposus), cervical 06/12/2017   Lesion of vulva 06/05/2016   Low back pain 11/08/2015   Cyst of left ovary 06/22/2015   Abnormal uterine bleeding 04/26/2015   Flushing 04/26/2015   Cyst of right ovary 03/29/2015   Uterine leiomyoma 03/29/2015   Weight gain  02/12/2014   Stress fracture of foot 12/29/2013   Low grade squamous intraepithelial lesion (LGSIL) on cervicovaginal cytologic smear 02/12/2010    Past Surgical History:  Procedure Laterality Date   ANTERIOR CERVICAL DECOMP/DISCECTOMY FUSION  2019   C5--6   AUGMENTATION MAMMAPLASTY Bilateral    18 years ago   BREAST ENHANCEMENT SURGERY Bilateral 2005   DILITATION & CURRETTAGE/HYSTROSCOPY WITH NOVASURE ABLATION N/A 03/02/2021   Procedure: Attempted HYSTEROSCOPY WITH NOVASURE ABLATION;  Surgeon: Lilton Legions, DO;  Location: [facility];  Service: Gynecology;  Laterality: N/A;   KNEE ARTHROSCOPY Bilateral 2012   LAPAROSCOPIC BILATERAL SALPINGECTOMY Bilateral 03/02/2021   Procedure: LAPAROSCOPIC BILATERAL SALPINGECTOMY;  Surgeon: Lilton Legions, DO;  Location: [facility] [place];  Service: Gynecology;  Laterality: Bilateral;  with possible myosure in laparoscopic bilateral salpingectomy   TEMPOROMANDIBULAR JOINT SURGERY Bilateral 1995   WRIST SURGERY Bilateral    2012  left wrist tendon repair;/   2015  right wrist tendon repair, ganglion cyst excision, graft    OB History   No obstetric history on file.      Home Medications    Prior to Admission medications  Medication Sig Start Date End Date Taking? Authorizing Provider  Ascorbic Acid (VITA-C PO) Take by mouth daily at 12 noon.   Yes [provider]  ASHWAGANDHA PO Take by mouth daily.   Yes [provider]  b complex vitamins capsule Take 1 capsule by mouth daily.   Yes [provider]  BIOTIN PO Take by mouth daily.   Yes [provider]  Cholecalciferol (VITAMIN D3 PO) Take by mouth daily.   Yes [provider]  Ginger, Zingiber officinalis, (GINGER ROOT PO) Take by mouth daily.   Yes [provider]  imipramine (TOFRANIL) 50 MG tablet Take 2 tablets by mouth daily.   Yes [provider]  MAGNESIUM PO Take by mouth daily.   Yes  [provider]  MILK THISTLE PO Take by mouth daily at 12 noon.   Yes [provider]  Misc Natural Products (MAGIC MUSHROOM MIX) CAPS Take by mouth daily. Mushroom complex   Yes [provider]  Multiple Vitamins-Minerals (ZINC PO) Take by mouth daily.   Yes [provider]  Omega-3 Fatty Acids (FISH OIL PO) Take by mouth daily.   Yes [provider]  PINE BARK, PYCNOGENOL, PO Take by mouth daily.   Yes [provider]  Probiotic Product (PROBIOTIC PO) Take by mouth daily.   Yes [provider]  tranexamic acid (LYSTEDA) 650 MG TABS tablet Take 1,300 mg by mouth as directed.   Yes [provider]  TURMERIC PO Take by mouth daily at 12 noon.   Yes [provider]  VITAMIN E PO Take by mouth daily.   Yes [provider]  albuterol (PROAIR HFA) 108 (90 Base) MCG/ACT inhaler Inhale 2 puffs into the lungs every 4 (four) hours as needed for wheezing or shortness of breath. Patient not taking: Reported on 11/13/2023    [provider]  Albuterol Sulfate (PROAIR RESPICLICK) 108 (90 Base) MCG/ACT AEPB Inhale 2 puffs into the lungs every 4 (four) hours as needed. Patient not taking: Reported on 11/13/2023    [provider]  ammonium lactate (LAC-HYDRIN) 12 % lotion Apply 1 Application topically as needed. Patient not taking: Reported on 11/13/2023    [provider]  amoxicillin  (AMOXIL ) 875 MG tablet Take 875 mg by mouth 2 (two) times daily. Patient not taking: Reported on 08/21/2022    [provider]  amoxicillin -clavulanate (AUGMENTIN) 875-125 MG tablet Take 1 tablet by mouth 2 (two) times daily. Patient not taking: Reported on 08/21/2022    [provider]  azithromycin (ZITHROMAX) 250 MG tablet Take 250 mg by mouth daily. Patient not taking: Reported on 08/21/2022    [provider]  brompheniramine-pseudoephedrine -DM 30-2-10 MG/5ML syrup Take 10 mLs by mouth  every 6 (six) hours as needed (cough and congestion). Patient not taking: Reported on 01/30/2024 11/13/23   Murrill, Samantha, FNP  clarithromycin (BIAXIN XL) 500 MG 24 hr tablet Take 500 mg by mouth daily. Patient not taking: Reported on 08/21/2022    [provider]  clindamycin (CLEOCIN) 300 MG capsule Take 300 mg by mouth as directed. Patient not taking: Reported on 08/21/2022    [provider]  cyclobenzaprine (FLEXERIL) 10 MG tablet Take 10 mg by mouth as directed. Patient not taking: Reported on 11/13/2023    [provider]  cyclobenzaprine (FLEXERIL) 5 MG tablet Take 5 mg by mouth as directed. Patient not taking: Reported on 11/13/2023    [provider]  diazepam  (VALIUM ) 10 MG tablet Take 10 mg by mouth as directed. Patient not taking: Reported on 11/13/2023    [provider]  doxycycline  (VIBRA -TABS) 100 MG tablet Take 100 mg by mouth daily. Patient not taking: Reported on 08/21/2022    [provider]  doxycycline  (VIBRAMYCIN ) 100 MG capsule Take 1 capsule (100 mg total) by mouth 2 (two) times daily. Patient not taking: Reported on 08/21/2022 10/07/21   Raspet, Erin K, PA-C  fluconazole (DIFLUCAN) 150 MG tablet Take 150 mg by mouth once. Patient not taking: Reported on 08/21/2022    [provider]  fluticasone  (FLONASE ) 50 MCG/ACT nasal spray Place 2 sprays into both nostrils daily. Shake well before use. Gently blow nose before spraying. Do not blow nose immediately after use. You should not taste the medication or feel it going down your throat; if you do, adjust your technique. Patient not taking: Reported on 01/30/2024 11/13/23   Iola Lukes, FNP  fluticasone -salmeterol (ADVAIR DISKUS) 250-50 MCG/ACT AEPB Inhale 1 puff into the lungs in the morning and at bedtime. Patient not taking: Reported on 11/13/2023    [provider]  fluticasone -salmeterol (ADVAIR DISKUS) 250-50 MCG/ACT AEPB Inhale 1 puff into the lungs in  the morning and at bedtime. Patient not taking: Reported on 11/13/2023    [provider]  fluticasone -salmeterol (ADVAIR DISKUS) 250-50 MCG/ACT AEPB     [provider]  fluticasone -salmeterol (ADVAIR) 250-50 MCG/ACT AEPB Inhale 1 puff into the lungs in the morning and at bedtime. Patient not taking: Reported on 11/13/2023    [provider]  HYDROcodone -acetaminophen  (NORCO/VICODIN) 5-325 MG tablet Take 2 tablets by mouth every 4 (four) hours as needed. Patient not taking: Reported on 01/30/2024 12/03/23   Myriam Dorn BROCKS, PA  HYDROcodone -homatropine (HYDROMET) 5-1.5 MG/5ML syrup Take 5 mLs by mouth every 4 (four) hours as needed for cough. Patient not taking: Reported on 08/21/2022    [provider]  imipramine (TOFRANIL) 25 MG tablet Take 50 mg by mouth daily.    [provider]  imipramine (TOFRANIL) 25 MG tablet Take 25 mg by mouth as directed.    [provider]  Imipramine HCl (TOFRANIL PO) imipramine HCl 02/13/11   [provider]  IMIPRAMINE HCL PO imipramine HCl 02/13/11   [provider]  IMIPRAMINE HCL PO Imipramine HCl    [provider]  IMIPRAMINE HCL PO  02/13/11   [provider]  methocarbamol (ROBAXIN) 500 MG tablet Take 500 mg by mouth as directed. Patient not taking: Reported on 11/13/2023    [provider]  methylPREDNISolone  (MEDROL  DOSEPAK) 4 MG TBPK tablet Medrol  dose pak -take as per manufacturer's instructions Patient not taking: Reported on 01/30/2024 11/27/23   Laurice Maude BROCKS, MD  mometasone (NASONEX) 50 MCG/ACT nasal spray Place 2 sprays into the nose daily. Patient not taking: Reported on 11/13/2023    [provider]  norethindrone (ORTHO MICRONOR) 0.35 MG tablet Take 1 tablet by mouth daily. Patient not taking: Reported on 11/13/2023    [provider]  NORETHINDRONE PO Take 1 tablet by mouth daily at 6 (six) AM. Patient not taking: Reported on  11/13/2023    [provider]  norethindrone-ethinyl estradiol-iron (TRI-LEGEST FE) 1-20/1-30/1-35 MG-MCG tablet Take 1 tablet by mouth daily. Patient not taking: Reported on 11/13/2023    [provider]  norethindrone-ethinyl estradiol-iron (TRI-LEGEST FE) 1-20/1-30/1-35 MG-MCG tablet Take 1 tablet by mouth daily. Patient not taking: Reported on 11/13/2023    [provider]  Norgestimate-Ethinyl Estradiol Triphasic 0.18/0.215/0.25 MG-35 MCG tablet Take 1 tablet by mouth daily. Patient not taking: Reported on 11/13/2023    [provider]  Norgestimate-Ethinyl Estradiol Triphasic 0.18/0.215/0.25 MG-35 MCG tablet Take 1 tablet by mouth daily. Patient not taking: Reported on 11/13/2023  [provider]  ondansetron  (ZOFRAN -ODT) 4 MG disintegrating tablet Take 1 tablet (4 mg total) by mouth every 8 (eight) hours as needed for nausea or vomiting. Patient not taking: Reported on 11/13/2023 10/07/21   Raspet, Erin K, PA-C  ondansetron  (ZOFRAN -ODT) 8 MG disintegrating tablet Take 8 mg by mouth every 8 (eight) hours as needed for nausea or vomiting. Patient not taking: Reported on 11/13/2023    [provider]  oseltamivir (TAMIFLU) 75 MG capsule Take 75 mg by mouth daily. Patient not taking: Reported on 11/13/2023    [provider]  oxyCODONE  (OXY IR/ROXICODONE ) 5 MG immediate release tablet Take 1 tablet (5 mg total) by mouth every 6 (six) hours as needed for severe pain or moderate pain. Patient not taking: Reported on 11/13/2023 03/02/21   Lilton Legions, DO  phentermine (ADIPEX-P) 37.5 MG tablet Take 37.5 mg by mouth daily before breakfast. Patient not taking: Reported on 11/13/2023 01/10/22   [provider]  sulfamethoxazole-trimethoprim (BACTRIM DS) 800-160 MG tablet Take 1 tablet by mouth daily. Patient not taking: Reported on 11/13/2023    [provider]  SUMAtriptan (IMITREX) 100 MG tablet Take 100 mg by mouth  once. Patient not taking: Reported on 11/13/2023    [provider]  tiZANidine  (ZANAFLEX ) 4 MG tablet Take 1 tablet (4 mg total) by mouth every 6 (six) hours as needed for muscle spasms. Patient not taking: Reported on 01/30/2024 11/27/23   Laurice Maude BROCKS, MD  traMADol (ULTRAM) 50 MG tablet Take 50 mg by mouth as directed. Patient not taking: Reported on 11/13/2023    [provider]  UNABLE TO FIND BCP Patient not taking: Reported on 11/13/2023    [provider]    Family History Family History  Problem Relation Age of Onset   Breast cancer Sister    Breast cancer Paternal Aunt     Social History Social History[1]   Allergies   Ibuprofen   Review of Systems Review of Systems  Respiratory:  Positive for cough.      Physical Exam Triage Vital Signs ED Triage Vitals  Encounter Vitals Group     BP 01/30/24 1747 (!) 155/98     Girls Systolic BP Percentile --      Girls Diastolic BP Percentile --      Boys Systolic BP Percentile --      Boys Diastolic BP Percentile --      Pulse Rate 01/30/24 1747 98     Resp 01/30/24 1747 16     Temp 01/30/24 1747 98.5 F (36.9 C)     Temp Source 01/30/24 1747 Oral     SpO2 01/30/24 1747 98 %     Weight --      Height --      Head Circumference --      Peak Flow --      Pain Score 01/30/24 1743 2     Pain Loc --      Pain Education --      Exclude from Growth Chart --    No data found.  Updated Vital Signs BP (!) 155/98 (BP Location: Left Arm)   Pulse 98   Temp 98.5 F (36.9 C) (Oral)   Resp 16   LMP 01/20/2024 (Approximate)   SpO2 98%   Visual Acuity Right Eye Distance:   Left Eye Distance:   Bilateral Distance:    Right Eye Near:   Left Eye Near:    Bilateral Near:  Physical Exam Vitals and nursing note reviewed.  Constitutional:      General: She is not in acute distress.    Appearance: Normal appearance. She is not ill-appearing, toxic-appearing or diaphoretic.  HENT:      Nose: Congestion (moderately enlarged turbinates) present. No rhinorrhea.     Mouth/Throat:     Mouth: Mucous membranes are moist.     Pharynx: Oropharynx is clear. No oropharyngeal exudate or posterior oropharyngeal erythema.  Eyes:     General: No scleral icterus. Cardiovascular:     Rate and Rhythm: Normal rate and regular rhythm.     Heart sounds: Normal heart sounds.  Pulmonary:     Effort: Pulmonary effort is normal. No respiratory distress.     Breath sounds: Normal breath sounds. No wheezing or rhonchi.  Skin:    General: Skin is warm.  Neurological:     Mental Status: She is alert and oriented to person, place, and time.  Psychiatric:        Mood and Affect: Mood normal.        Behavior: Behavior normal.      UC Treatments / Results  Labs (all labs ordered are listed, but only abnormal results are displayed) Labs Reviewed  POC COVID19/FLU A&B COMBO    EKG   Radiology No results found.  Procedures Procedures (including critical care time)  Medications Ordered in UC Medications - No data to display  Initial Impression / Assessment and Plan / UC Course  I have reviewed the triage vital signs and the nursing notes.  Pertinent labs & imaging results that were available during my care of the patient were reviewed by me and considered in my medical decision making (see chart for details).    Final Clinical Impressions(s) / UC Diagnoses   Final diagnoses:  Viral URI   Discharge Instructions   None    ED Prescriptions   None    PDMP not reviewed this encounter.     [1]  Social History Tobacco Use   Smoking status: Never   Smokeless tobacco: Never  Vaping Use   Vaping status: Former   Substances: THC, CBD   Devices: unsure  Substance Use Topics   Alcohol use: Yes    Comment: occasional   Drug use: Not Currently    Types: Marijuana     Andra Corean BROCKS, PA-C 01/30/24 2008

## 2024-01-30 NOTE — Discharge Instructions (Signed)

## 2024-01-30 NOTE — ED Triage Notes (Signed)
 C/o cough, mild body aches, and post nasal drip, onset today. States she felt like I was going to get sick yesterday. No fever. She has tried OTC cold/cough meds. States it hurts when I breathe-in or cough. States RSV is going around work.

## 2024-02-05 ENCOUNTER — Ambulatory Visit
Admission: EM | Admit: 2024-02-05 | Discharge: 2024-02-05 | Disposition: A | Discharge status: Home / Self Care | Attending: Internal Medicine | Admitting: Internal Medicine

## 2024-02-05 DIAGNOSIS — R051 Acute cough: Secondary | ICD-10-CM | POA: Diagnosis not present

## 2024-02-05 DIAGNOSIS — R062 Wheezing: Secondary | ICD-10-CM

## 2024-02-05 DIAGNOSIS — J069 Acute upper respiratory infection, unspecified: Secondary | ICD-10-CM

## 2024-02-05 MED ORDER — PREDNISONE 20 MG PO TABS: 40.0000 mg | ORAL_TABLET | Freq: Every day | ORAL | 0 refills | Status: AC

## 2024-02-05 MED ORDER — HYDROCOD POLI-CHLORPHE POLI ER 10-8 MG/5ML PO SUER: 5.0000 mL | Freq: Two times a day (BID) | ORAL | 0 refills | Status: DC | PRN

## 2024-02-05 MED ORDER — PREDNISONE 20 MG PO TABS: 40.0000 mg | ORAL_TABLET | Freq: Every day | ORAL | 0 refills | Status: DC

## 2024-02-05 MED ORDER — AMOXICILLIN-POT CLAVULANATE 875-125 MG PO TABS: 1.0000 | ORAL_TABLET | Freq: Two times a day (BID) | ORAL | 0 refills | Status: DC

## 2024-02-05 MED ORDER — HYDROCOD POLI-CHLORPHE POLI ER 10-8 MG/5ML PO SUER: 5.0000 mL | Freq: Two times a day (BID) | ORAL | 0 refills | Status: AC | PRN

## 2024-02-05 MED ORDER — ALBUTEROL SULFATE HFA 108 (90 BASE) MCG/ACT IN AERS: 1.0000 | INHALATION_SPRAY | Freq: Four times a day (QID) | RESPIRATORY_TRACT | 0 refills | Status: DC | PRN

## 2024-02-05 MED ORDER — AMOXICILLIN-POT CLAVULANATE 875-125 MG PO TABS: 1.0000 | ORAL_TABLET | Freq: Two times a day (BID) | ORAL | 0 refills | Status: AC

## 2024-02-05 MED ORDER — ALBUTEROL SULFATE HFA 108 (90 BASE) MCG/ACT IN AERS: 1.0000 | INHALATION_SPRAY | Freq: Four times a day (QID) | RESPIRATORY_TRACT | 0 refills | Status: AC | PRN

## 2024-02-05 NOTE — ED Notes (Signed)
 Called x 1 no answer

## 2024-02-05 NOTE — Discharge Instructions (Addendum)
 Symptoms and physical exam findings are most consistent with an upper respiratory infection.  This has been going on for almost 2 weeks now and is likely bacterial in nature.  Given the severity of the symptoms we will treat with antibiotics by mouth as well as medication for cough, shortness of breath and inflammation.  We will treat with the following: Augmentin  875 mg twice daily for 7 days.  This is an antibiotic.  Take this with food.  Prednisone  40 mg (2 tablets) once daily for 5 days. Take this in the morning.  This is a steroid to help with inflammation and pain. Albuterol  inhaler 1-2 puffs every 6 hours as needed for wheezing/shortness of breath. Tussionex 5 mL every 12 hours as needed for cough.  Use caution as this medication can cause drowsiness. Do not drive while taking this medication  Make sure to stay hydrated by drinking plenty of water. Return to urgent care or PCP if symptoms worsen or fail to resolve.

## 2024-02-05 NOTE — ED Triage Notes (Signed)
 Pt seen at a different urgent care this morning and prescribed meds for ongoing cough symptoms. The meds weren't received by her pharmacy and now the other Urgent Care is closed. She was seen here 12/15 for same and is no better.

## 2024-02-05 NOTE — ED Provider Notes (Signed)
 " [name]    [name]: [id number] Arrival date & time: 02/05/24  1104      History   Chief Complaint Chief Complaint  Patient presents with   Cough    HPI Stacey Rios is a 48 y.o. female.   48 year old female presents urgent care with complaints of persistent cough, congestion and shortness of breath.  She reports that her symptoms and now been going on almost 2 weeks.  She was initially treated as a viral infection but has progressively worsened.  She reports the cough is almost constant now.  She will get into coughing fits where she is gagging and nearly throwing up.  She denies any fevers, chills.  She has had some shortness of breath with this.   Cough Associated symptoms: shortness of breath   Associated symptoms: no chest pain, no chills, no ear pain, no fever, no rash and no sore throat     Past Medical History:  Diagnosis Date   Complication of anesthesia    pt request not to be given pre-anesthesia medication because she will not wake up for very long time   Degenerative disc disease, lumbar    Family history of adverse reaction to anesthesia    father--- combative   History of ovarian cyst    Menorrhagia    Migraines    OA (osteoarthritis)    PONV (postoperative nausea and vomiting)    Wears contact lenses     Patient Active Problem List   Diagnosis Date Noted   Obesity with body mass index 30 or greater 08/21/2022   Finding of above normal blood pressure 08/21/2022   Acute maxillary sinusitis 08/21/2022   Allergic rhinitis 08/21/2022   Bacterial vaginosis 08/21/2022   Cough 08/21/2022   Melanocytic nevus of trunk 08/21/2022   Body mass index (BMI) 34.0-34.9, adult 08/21/2022   Posterior rhinorrhea 08/21/2022   Vaginitis 08/21/2022   Family history of breast cancer 01/01/2022   Urinary incontinence 01/01/2022   Abnormal mammogram 12/26/2021   Neck pain 04/01/2019   Neck problem 02/10/2018   Cervical radiculopathy 06/12/2017   HNP  (herniated nucleus pulposus), cervical 06/12/2017   Lesion of vulva 06/05/2016   Low back pain 11/08/2015   Cyst of left ovary 06/22/2015   Abnormal uterine bleeding 04/26/2015   Flushing 04/26/2015   Cyst of right ovary 03/29/2015   Uterine leiomyoma 03/29/2015   Weight gain 02/12/2014   Stress fracture of foot 12/29/2013   Low grade squamous intraepithelial lesion (LGSIL) on cervicovaginal cytologic smear 02/12/2010    Past Surgical History:  Procedure Laterality Date   ANTERIOR CERVICAL DECOMP/DISCECTOMY FUSION  2019   C5--6   AUGMENTATION MAMMAPLASTY Bilateral    18 years ago   BREAST ENHANCEMENT SURGERY Bilateral 2005   DILITATION & CURRETTAGE/HYSTROSCOPY WITH NOVASURE ABLATION N/A 03/02/2021   Procedure: Attempted HYSTEROSCOPY WITH NOVASURE ABLATION;  Surgeon: Lilton Legions, DO;  Location: [facility] [place];  Service: Gynecology;  Laterality: N/A;   KNEE ARTHROSCOPY Bilateral 2012   LAPAROSCOPIC BILATERAL SALPINGECTOMY Bilateral 03/02/2021   Procedure: LAPAROSCOPIC BILATERAL SALPINGECTOMY;  Surgeon: Lilton Legions, DO;  Location: [facility];  Service: Gynecology;  Laterality: Bilateral;  with possible myosure in laparoscopic bilateral salpingectomy   TEMPOROMANDIBULAR JOINT SURGERY Bilateral 1995   WRIST SURGERY Bilateral    2012  left wrist tendon repair;/   2015  right wrist tendon repair, ganglion cyst excision, graft    OB History   No obstetric history on file.  Home Medications    Prior to Admission medications  Medication Sig Start Date End Date Taking? Authorizing Provider  albuterol  (VENTOLIN  HFA) 108 (90 Base) MCG/ACT inhaler Inhale 1-2 puffs into the lungs every 6 (six) hours as needed for wheezing or shortness of breath. 02/05/24   Crystalina Stodghill A, PA-C  ammonium lactate (LAC-HYDRIN) 12 % lotion Apply 1 Application topically as needed. Patient not taking: Reported on 11/13/2023    [provider]   amoxicillin -clavulanate (AUGMENTIN ) 875-125 MG tablet Take 1 tablet by mouth every 12 (twelve) hours. 02/05/24   Venisha Boehning A, PA-C  Ascorbic Acid (VITA-C PO) Take by mouth daily at 12 noon.    [provider]  ASHWAGANDHA PO Take by mouth daily.    [provider]  b complex vitamins capsule Take 1 capsule by mouth daily.    [provider]  BIOTIN PO Take by mouth daily.    [provider]  chlorpheniramine-HYDROcodone  (TUSSIONEX) 10-8 MG/5ML Take 5 mLs by mouth every 12 (twelve) hours as needed for cough. 02/05/24   Nyal Schachter A, PA-C  Cholecalciferol (VITAMIN D3 PO) Take by mouth daily.    [provider]  cyclobenzaprine (FLEXERIL) 10 MG tablet Take 10 mg by mouth as directed. Patient not taking: Reported on 11/13/2023    [provider]  cyclobenzaprine (FLEXERIL) 5 MG tablet Take 5 mg by mouth as directed. Patient not taking: Reported on 11/13/2023    [provider]  diazepam  (VALIUM ) 10 MG tablet Take 10 mg by mouth as directed. Patient not taking: Reported on 11/13/2023    [provider]  fluconazole (DIFLUCAN) 150 MG tablet Take 150 mg by mouth once. Patient not taking: Reported on 08/21/2022    [provider]  fluticasone  (FLONASE ) 50 MCG/ACT nasal spray Place 2 sprays into both nostrils daily. Shake well before use. Gently blow nose before spraying. Do not blow nose immediately after use. You should not taste the medication or feel it going down your throat; if you do, adjust your technique. Patient not taking: Reported on 01/30/2024 11/13/23   Murrill, Samantha, FNP  fluticasone -salmeterol (ADVAIR DISKUS) 250-50 MCG/ACT AEPB Inhale 1 puff into the lungs in the morning and at bedtime. Patient not taking: Reported on 11/13/2023    [provider]  fluticasone -salmeterol (ADVAIR DISKUS) 250-50 MCG/ACT AEPB Inhale 1 puff into the lungs in the morning and at bedtime. Patient not taking:  Reported on 11/13/2023    [provider]  fluticasone -salmeterol (ADVAIR DISKUS) 250-50 MCG/ACT AEPB     [provider]  fluticasone -salmeterol (ADVAIR) 250-50 MCG/ACT AEPB Inhale 1 puff into the lungs in the morning and at bedtime. Patient not taking: Reported on 11/13/2023    [provider]  Ginger, Zingiber officinalis, (GINGER ROOT PO) Take by mouth daily.    [provider]  HYDROcodone -acetaminophen  (NORCO/VICODIN) 5-325 MG tablet Take 2 tablets by mouth every 4 (four) hours as needed. Patient not taking: Reported on 01/30/2024 12/03/23   Myriam Dorn BROCKS, PA  HYDROcodone -homatropine (HYDROMET) 5-1.5 MG/5ML syrup Take 5 mLs by mouth every 4 (four) hours as needed for cough. Patient not taking: Reported on 08/21/2022    [provider]  imipramine (TOFRANIL) 25 MG tablet Take 50 mg by mouth daily.    [provider]  imipramine (TOFRANIL) 25 MG tablet Take 25 mg by mouth as directed.    [provider]  imipramine (TOFRANIL) 50 MG tablet Take 2 tablets by mouth daily.    [provider]  Imipramine HCl (TOFRANIL PO) imipramine HCl 02/13/11   [provider]  IMIPRAMINE HCL PO imipramine HCl 02/13/11   [provider]  IMIPRAMINE HCL PO Imipramine HCl    [provider]  IMIPRAMINE HCL PO  02/13/11   [provider]  MAGNESIUM PO Take by mouth daily.    [provider]  methocarbamol (ROBAXIN) 500 MG tablet Take 500 mg by mouth as directed. Patient not taking: Reported on 11/13/2023    [provider]  MILK THISTLE PO Take by mouth daily at 12 noon.    [provider]  Misc Natural Products (MAGIC MUSHROOM MIX) CAPS Take by mouth daily. Mushroom complex    [provider]  mometasone (NASONEX) 50 MCG/ACT nasal spray Place 2 sprays into the nose daily. Patient not taking: Reported on 11/13/2023    [provider]  Multiple Vitamins-Minerals (ZINC  PO) Take by mouth daily.    [provider]  norethindrone (ORTHO MICRONOR) 0.35 MG tablet Take 1 tablet by mouth daily. Patient not taking: Reported on 11/13/2023    [provider]  NORETHINDRONE PO Take 1 tablet by mouth daily at 6 (six) AM. Patient not taking: Reported on 11/13/2023    [provider]  norethindrone-ethinyl estradiol-iron (TRI-LEGEST FE) 1-20/1-30/1-35 MG-MCG tablet Take 1 tablet by mouth daily. Patient not taking: Reported on 11/13/2023    [provider]  norethindrone-ethinyl estradiol-iron (TRI-LEGEST FE) 1-20/1-30/1-35 MG-MCG tablet Take 1 tablet by mouth daily. Patient not taking: Reported on 11/13/2023    [provider]  Norgestimate-Ethinyl Estradiol Triphasic 0.18/0.215/0.25 MG-35 MCG tablet Take 1 tablet by mouth daily. Patient not taking: Reported on 11/13/2023    [provider]  Norgestimate-Ethinyl Estradiol Triphasic 0.18/0.215/0.25 MG-35 MCG tablet Take 1 tablet by mouth daily. Patient not taking: Reported on 11/13/2023    [provider]  Omega-3 Fatty Acids (FISH OIL PO) Take by mouth daily.    [provider]  ondansetron  (ZOFRAN -ODT) 4 MG disintegrating tablet Take 1 tablet (4 mg total) by mouth every 8 (eight) hours as needed for nausea or vomiting. Patient not taking: Reported on 11/13/2023 10/07/21   Raspet, Erin K, PA-C  ondansetron  (ZOFRAN -ODT) 8 MG disintegrating tablet Take 8 mg by mouth every 8 (eight) hours as needed for nausea or vomiting. Patient not taking: Reported on 11/13/2023    [provider]  oseltamivir (TAMIFLU) 75 MG capsule Take 75 mg by mouth daily. Patient not taking: Reported on 11/13/2023    [provider]  phentermine (ADIPEX-P) 37.5 MG tablet Take 37.5 mg by mouth daily before breakfast. Patient not taking: Reported on 11/13/2023 01/10/22   [provider]  PINE BARK, PYCNOGENOL, PO Take by mouth daily.    [provider]   predniSONE  (DELTASONE ) 20 MG tablet Take 2 tablets (40 mg total) by mouth daily with breakfast for 5 days. 02/05/24 02/10/24  Teresa Almarie LABOR, PA-C  Probiotic Product (PROBIOTIC PO) Take by mouth daily.    [provider]  SUMAtriptan (IMITREX) 100 MG tablet Take 100 mg by mouth once. Patient not taking: Reported on 11/13/2023    [provider]  tiZANidine  (ZANAFLEX ) 4 MG tablet Take 1 tablet (4 mg total) by mouth every 6 (six) hours as needed for muscle spasms. Patient not taking: Reported on 01/30/2024 11/27/23   Laurice Maude BROCKS, MD  traMADol (ULTRAM) 50 MG tablet Take 50 mg by mouth as directed. Patient not taking: Reported on 11/13/2023  [provider]  tranexamic acid (LYSTEDA) 650 MG TABS tablet Take 1,300 mg by mouth as directed.    [provider]  TURMERIC PO Take by mouth daily at 12 noon.    [provider]  UNABLE TO FIND BCP Patient not taking: Reported on 11/13/2023    [provider]  VITAMIN E PO Take by mouth daily.    [provider]    Family History Family History  Problem Relation Age of Onset   Breast cancer Sister    Breast cancer Paternal Aunt     Social History Social History[1]   Allergies   Ibuprofen   Review of Systems Review of Systems  Constitutional:  Negative for chills and fever.  HENT:  Positive for congestion. Negative for ear pain and sore throat.   Eyes:  Negative for pain and visual disturbance.  Respiratory:  Positive for cough and shortness of breath.   Cardiovascular:  Negative for chest pain and palpitations.  Gastrointestinal:  Positive for nausea. Negative for abdominal pain and vomiting.  Genitourinary:  Negative for dysuria and hematuria.  Musculoskeletal:  Negative for arthralgias and back pain.  Skin:  Negative for color change and rash.  Neurological:  Negative for seizures and syncope.  All other systems reviewed and are negative.    Physical  Exam Triage Vital Signs ED Triage Vitals  Encounter Vitals Group     BP 02/05/24 1253 (!) 142/95     Girls Systolic BP Percentile --      Girls Diastolic BP Percentile --      Boys Systolic BP Percentile --      Boys Diastolic BP Percentile --      Pulse Rate 02/05/24 1253 98     Resp 02/05/24 1253 18     Temp 02/05/24 1253 98.3 F (36.8 C)     Temp Source 02/05/24 1253 Oral     SpO2 02/05/24 1253 98 %     Weight --      Height --      Head Circumference --      Peak Flow --      Pain Score 02/05/24 1251 5     Pain Loc --      Pain Education --      Exclude from Growth Chart --    No data found.  Updated Vital Signs BP (!) 142/95 (BP Location: Left Arm)   Pulse 98   Temp 98.3 F (36.8 C) (Oral)   Resp 18   LMP 01/20/2024 (Approximate)   SpO2 98%   Visual Acuity Right Eye Distance:   Left Eye Distance:   Bilateral Distance:    Right Eye Near:   Left Eye Near:    Bilateral Near:     Physical Exam Vitals and nursing note reviewed.  Constitutional:      General: She is not in acute distress.    Appearance: She is well-developed.  HENT:     Head: Normocephalic and atraumatic.  Eyes:     Conjunctiva/sclera: Conjunctivae normal.  Cardiovascular:     Rate and Rhythm: Normal rate and regular rhythm.     Heart sounds: No murmur heard. Pulmonary:     Effort: Pulmonary effort is normal. No respiratory distress.     Breath sounds: Wheezing (Faint wheeze in the upper lobes) present.  Abdominal:     Palpations: Abdomen is soft.     Tenderness: There is no abdominal tenderness.  Musculoskeletal:  General: No swelling.     Cervical back: Neck supple.  Skin:    General: Skin is warm and dry.     Capillary Refill: Capillary refill takes less than 2 seconds.  Neurological:     Mental Status: She is alert.  Psychiatric:        Mood and Affect: Mood normal.      UC Treatments / Results  Labs (all labs ordered are listed, but only abnormal results are  displayed) Labs Reviewed - No data to display  EKG   Radiology No results found.  Procedures Procedures (including critical care time)  Medications Ordered in UC Medications - No data to display  Initial Impression / Assessment and Plan / UC Course  I have reviewed the triage vital signs and the nursing notes.  Pertinent labs & imaging results that were available during my care of the patient were reviewed by me and considered in my medical decision making (see chart for details).     Acute upper respiratory infection  Acute cough  Wheezing   Symptoms and physical exam findings are most consistent with an upper respiratory infection.  This has been going on for almost 2 weeks now and is likely bacterial in nature.  Given the severity of the symptoms we will treat with antibiotics by mouth as well as medication for cough, shortness of breath and inflammation.  We will treat with the following: Augmentin  875 mg twice daily for 7 days.  This is an antibiotic.  Take this with food.  Prednisone  40 mg (2 tablets) once daily for 5 days. Take this in the morning.  This is a steroid to help with inflammation and pain. Albuterol  inhaler 1-2 puffs every 6 hours as needed for wheezing/shortness of breath. Tussionex 5 mL every 12 hours as needed for cough.  Use caution as this medication can cause drowsiness. Do not drive while taking this medication  Make sure to stay hydrated by drinking plenty of water. Return to urgent care or PCP if symptoms worsen or fail to resolve.    Final Clinical Impressions(s) / UC Diagnoses   Final diagnoses:  Acute upper respiratory infection  Acute cough  Wheezing     Discharge Instructions      Symptoms and physical exam findings are most consistent with an upper respiratory infection.  This has been going on for almost 2 weeks now and is likely bacterial in nature.  Given the severity of the symptoms we will treat with antibiotics by mouth as well  as medication for cough, shortness of breath and inflammation.  We will treat with the following: Augmentin  875 mg twice daily for 7 days.  This is an antibiotic.  Take this with food.  Prednisone  40 mg (2 tablets) once daily for 5 days. Take this in the morning.  This is a steroid to help with inflammation and pain. Albuterol  inhaler 1-2 puffs every 6 hours as needed for wheezing/shortness of breath. Tussionex 5 mL every 12 hours as needed for cough.  Use caution as this medication can cause drowsiness. Do not drive while taking this medication  Make sure to stay hydrated by drinking plenty of water. Return to urgent care or PCP if symptoms worsen or fail to resolve.      ED Prescriptions     Medication Sig Dispense Auth. Provider   amoxicillin -clavulanate (AUGMENTIN ) 875-125 MG tablet  (Status: Discontinued) Take 1 tablet by mouth every 12 (twelve) hours. 14 tablet Teresa Almarie LABOR, PA-C  chlorpheniramine-HYDROcodone  (TUSSIONEX) 10-8 MG/5ML  (Status: Discontinued) Take 5 mLs by mouth every 12 (twelve) hours as needed for cough. 70 mL Morghan Kester A, PA-C   albuterol  (VENTOLIN  HFA) 108 (90 Base) MCG/ACT inhaler  (Status: Discontinued) Inhale 1-2 puffs into the lungs every 6 (six) hours as needed for wheezing or shortness of breath. 6.7 g Alysia Scism A, PA-C   predniSONE  (DELTASONE ) 20 MG tablet  (Status: Discontinued) Take 2 tablets (40 mg total) by mouth daily with breakfast for 5 days. 10 tablet Teresa Norris A, PA-C   albuterol  (VENTOLIN  HFA) 108 (90 Base) MCG/ACT inhaler  (Status: Discontinued) Inhale 1-2 puffs into the lungs every 6 (six) hours as needed for wheezing or shortness of breath. 6.7 g Teresa Norris A, PA-C   amoxicillin -clavulanate (AUGMENTIN ) 875-125 MG tablet  (Status: Discontinued) Take 1 tablet by mouth every 12 (twelve) hours. 14 tablet Dexter Signor A, PA-C   chlorpheniramine-HYDROcodone  (TUSSIONEX) 10-8 MG/5ML  (Status: Discontinued) Take 5 mLs by  mouth every 12 (twelve) hours as needed for cough. 70 mL Paysley Poplar A, PA-C   predniSONE  (DELTASONE ) 20 MG tablet  (Status: Discontinued) Take 2 tablets (40 mg total) by mouth daily with breakfast for 5 days. 10 tablet Teresa Norris LABOR, PA-C   albuterol  (VENTOLIN  HFA) 108 (90 Base) MCG/ACT inhaler  (Status: Discontinued) Inhale 1-2 puffs into the lungs every 6 (six) hours as needed for wheezing or shortness of breath. 6.7 g Teresa Norris A, PA-C   amoxicillin -clavulanate (AUGMENTIN ) 875-125 MG tablet  (Status: Discontinued) Take 1 tablet by mouth every 12 (twelve) hours. 14 tablet Fritzi Scripter A, PA-C   chlorpheniramine-HYDROcodone  (TUSSIONEX) 10-8 MG/5ML  (Status: Discontinued) Take 5 mLs by mouth every 12 (twelve) hours as needed for cough. 70 mL Nevyn Bossman A, PA-C   predniSONE  (DELTASONE ) 20 MG tablet  (Status: Discontinued) Take 2 tablets (40 mg total) by mouth daily with breakfast for 5 days. 10 tablet Teresa Norris A, PA-C   albuterol  (VENTOLIN  HFA) 108 (90 Base) MCG/ACT inhaler  (Status: Discontinued) Inhale 1-2 puffs into the lungs every 6 (six) hours as needed for wheezing or shortness of breath. 6.7 g Teresa Norris A, PA-C   amoxicillin -clavulanate (AUGMENTIN ) 875-125 MG tablet  (Status: Discontinued) Take 1 tablet by mouth every 12 (twelve) hours. 14 tablet Lineth Thielke A, PA-C   chlorpheniramine-HYDROcodone  (TUSSIONEX) 10-8 MG/5ML  (Status: Discontinued) Take 5 mLs by mouth every 12 (twelve) hours as needed for cough. 70 mL Kingstin Heims A, PA-C   predniSONE  (DELTASONE ) 20 MG tablet  (Status: Discontinued) Take 2 tablets (40 mg total) by mouth daily with breakfast for 5 days. 10 tablet Teresa Norris A, PA-C   albuterol  (VENTOLIN  HFA) 108 (90 Base) MCG/ACT inhaler Inhale 1-2 puffs into the lungs every 6 (six) hours as needed for wheezing or shortness of breath. 6.7 g Teresa Norris A, PA-C   amoxicillin -clavulanate (AUGMENTIN ) 875-125 MG tablet Take 1  tablet by mouth every 12 (twelve) hours. 14 tablet Meiko Stranahan A, PA-C   chlorpheniramine-HYDROcodone  (TUSSIONEX) 10-8 MG/5ML Take 5 mLs by mouth every 12 (twelve) hours as needed for cough. 70 mL Krystal Teachey A, PA-C   predniSONE  (DELTASONE ) 20 MG tablet Take 2 tablets (40 mg total) by mouth daily with breakfast for 5 days. 10 tablet Teresa Norris LABOR, [place]      I have reviewed the PDMP during this encounter.    [1]  Social History Tobacco Use   Smoking status: Never   Smokeless tobacco: Never  Vaping Use  Vaping status: Former   Substances: THC, CBD   Devices: unsure  Substance Use Topics   Alcohol use: Yes    Comment: occasional   Drug use: Not Currently    Types: Marijuana     Teresa Almarie LABOR, PA-C 02/05/24 1324  "
